# Patient Record
Sex: Female | Born: 1950 | Race: Black or African American | Hispanic: No | Marital: Single | Smoking: Former smoker
Health system: Southern US, Community
[De-identification: ages and names within clinical notes are randomized; demographics above are authoritative.]

## PROBLEM LIST (undated history)

## (undated) DIAGNOSIS — F418 Other specified anxiety disorders: Secondary | ICD-10-CM

## (undated) DIAGNOSIS — R519 Headache, unspecified: Secondary | ICD-10-CM

## (undated) DIAGNOSIS — M1990 Unspecified osteoarthritis, unspecified site: Secondary | ICD-10-CM

## (undated) DIAGNOSIS — D219 Benign neoplasm of connective and other soft tissue, unspecified: Secondary | ICD-10-CM

## (undated) DIAGNOSIS — M2550 Pain in unspecified joint: Secondary | ICD-10-CM

## (undated) DIAGNOSIS — K5792 Diverticulitis of intestine, part unspecified, without perforation or abscess without bleeding: Secondary | ICD-10-CM

## (undated) DIAGNOSIS — K219 Gastro-esophageal reflux disease without esophagitis: Secondary | ICD-10-CM

## (undated) DIAGNOSIS — E7800 Pure hypercholesterolemia, unspecified: Secondary | ICD-10-CM

## (undated) DIAGNOSIS — H04123 Dry eye syndrome of bilateral lacrimal glands: Secondary | ICD-10-CM

## (undated) DIAGNOSIS — R51 Headache: Secondary | ICD-10-CM

## (undated) DIAGNOSIS — I10 Essential (primary) hypertension: Secondary | ICD-10-CM

## (undated) HISTORY — PX: COLONOSCOPY: SHX174

## (undated) HISTORY — DX: Pain in unspecified joint: M25.50

---
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
## 2005-04-08 ENCOUNTER — Emergency Department (HOSPITAL_COMMUNITY): Admission: EM | Admit: 2005-04-08 | Discharge: 2005-04-08 | Payer: Self-pay | Admitting: Emergency Medicine

## 2005-10-29 ENCOUNTER — Encounter: Admission: RE | Admit: 2005-10-29 | Discharge: 2005-10-29 | Payer: Self-pay | Admitting: Unknown Physician Specialty

## 2007-03-05 ENCOUNTER — Encounter (INDEPENDENT_AMBULATORY_CARE_PROVIDER_SITE_OTHER): Payer: Self-pay | Admitting: Internal Medicine

## 2007-03-05 ENCOUNTER — Observation Stay (HOSPITAL_COMMUNITY): Admission: EM | Admit: 2007-03-05 | Discharge: 2007-03-07 | Payer: Self-pay | Admitting: Emergency Medicine

## 2009-02-10 ENCOUNTER — Emergency Department (HOSPITAL_COMMUNITY): Admission: EM | Admit: 2009-02-10 | Discharge: 2009-02-10 | Payer: Self-pay | Admitting: Emergency Medicine

## 2010-02-26 ENCOUNTER — Observation Stay (HOSPITAL_COMMUNITY): Admission: EM | Admit: 2010-02-26 | Discharge: 2010-02-27 | Payer: Self-pay | Admitting: Emergency Medicine

## 2010-08-12 ENCOUNTER — Ambulatory Visit (HOSPITAL_COMMUNITY): Admission: RE | Admit: 2010-08-12 | Payer: Self-pay | Source: Home / Self Care | Admitting: Obstetrics and Gynecology

## 2010-11-08 LAB — DIFFERENTIAL
Basophils Relative: 0 % (ref 0–1)
Eosinophils Absolute: 0.1 10*3/uL (ref 0.0–0.7)
Neutro Abs: 6.7 10*3/uL (ref 1.7–7.7)
Neutrophils Relative %: 73 % (ref 43–77)

## 2010-11-08 LAB — COMPREHENSIVE METABOLIC PANEL
Albumin: 3.4 g/dL — ABNORMAL LOW (ref 3.5–5.2)
BUN: 10 mg/dL (ref 6–23)
Calcium: 8.6 mg/dL (ref 8.4–10.5)
Chloride: 100 mEq/L (ref 96–112)
GFR calc Af Amer: >60 mL/min (ref >60)
Glucose, Bld: 112 mg/dL — ABNORMAL HIGH (ref 70–99)
Potassium: 3.7 mEq/L (ref 3.5–5.1)
Sodium: 136 mEq/L (ref 135–145)

## 2010-11-08 LAB — BASIC METABOLIC PANEL
BUN: 10 mg/dL (ref 6–23)
CO2: 31 mEq/L (ref 19–32)
Calcium: 8.4 mg/dL (ref 8.4–10.5)
Chloride: 107 mEq/L (ref 96–112)
Creatinine, Ser: 0.87 mg/dL (ref 0.4–1.2)
GFR calc Af Amer: >60 mL/min (ref >60)
GFR calc non Af Amer: >60 mL/min (ref >60)
Sodium: 142 mEq/L (ref 135–145)

## 2010-11-08 LAB — CBC
HCT: 37.8 % (ref 36.0–46.0)
MCH: 29.8 pg (ref 26.0–34.0)
MCHC: 33.3 g/dL (ref 30.0–36.0)
Platelets: 367 10*3/uL (ref 150–400)
WBC: 5.3 10*3/uL (ref 4.0–10.5)
WBC: 9.2 10*3/uL (ref 4.0–10.5)

## 2010-11-08 LAB — CULTURE, BLOOD (ROUTINE X 2)

## 2010-11-08 LAB — URINE MICROSCOPIC-ADD ON

## 2010-11-08 LAB — LIPASE, BLOOD: Lipase: 19 U/L (ref 11–59)

## 2010-11-08 LAB — LIPID PANEL
HDL: 33 mg/dL — ABNORMAL LOW (ref >39)
Total CHOL/HDL Ratio: 5 RATIO
VLDL: 16 mg/dL (ref 0–40)

## 2010-11-08 LAB — URINALYSIS, ROUTINE W REFLEX MICROSCOPIC
Bilirubin Urine: NEGATIVE
Glucose, UA: NEGATIVE mg/dL
Ketones, ur: NEGATIVE mg/dL
Protein, ur: NEGATIVE mg/dL
Specific Gravity, Urine: 1.016 (ref 1.005–1.030)
Urobilinogen, UA: 0.2 mg/dL (ref 0.0–1.0)
pH: 7 (ref 5.0–8.0)

## 2010-11-08 LAB — URINE CULTURE

## 2010-11-30 LAB — POCT I-STAT, CHEM 8
BUN: 14 mg/dL (ref 6–23)
Chloride: 102 mEq/L (ref 96–112)
Creatinine, Ser: 1 mg/dL (ref 0.4–1.2)
Glucose, Bld: 113 mg/dL — ABNORMAL HIGH (ref 70–99)
HCT: 39 % (ref 36.0–46.0)
Hemoglobin: 13.3 g/dL (ref 12.0–15.0)
TCO2: 29 mmol/L (ref 0–100)

## 2010-11-30 LAB — POCT CARDIAC MARKERS
Troponin i, poc: <0.05 ng/mL (ref 0.00–0.09)
Troponin i, poc: <0.05 ng/mL (ref 0.00–0.09)

## 2011-01-05 NOTE — Discharge Summary (Signed)
[name], [name]                ACCOUNT NO.:  [id number]   MEDICAL RECORD NO.:  [id number]          PATIENT TYPE:  INP   LOCATION:  5506                         FACILITY:  [facility]   PHYSICIAN:  Hillery Aldo, M.D.   DATE OF BIRTH:  09-Apr-1951   DATE OF ADMISSION:  03/05/2007  DATE OF DISCHARGE:                               DISCHARGE SUMMARY   PRIMARY CARE PHYSICIAN:  [facility]   DISCHARGE DIAGNOSES:  1. Noncardiac chest pain.  2. Uncontrolled hypertension.  3. Dyslipidemia.  4. Mild obesity.   DISCHARGE MEDICATIONS:  1. Metoprolol 12.5 mg b.i.d.  2. Hydrochlorothiazide 25 mg daily.  3. Avapro 300 mg daily.  4. Lipitor 20 mg daily.   CONSULTATIONS:  Dr. Amil Amen of cardiology.   PROCEDURES AND DIAGNOSTIC STUDIES:  1. Chest x-ray on March 05, 2007, showed low-volume lordotic film with      cardiomegaly and vascular congestion.  2. Repeat chest x-ray on March 05, 2007, showed cardiomegaly and mild      vascular congestion.  3. A 2-D echocardiogram on March 05, 2007, showed normal left      ventricular systolic function with an ejection fraction estimated      to be 70%.  There were no left ventricular regional wall motion      abnormalities.  Left ventricular wall thickness was mildly to      moderately increased.  The left atrium was mild to moderately      dilated.  4. Cardiolite on March 06, 2007, showed no definite inducible ischemia      within the limits of breast attenuation of the anterior apex.      There was normal wall motion.  Calculated ejection fraction was      65%.   DISCHARGE LABORATORY DATA:  Cardiac enzymes were negative x3 sets.   HOSPITAL COURSE BY PROBLEM:  #1 - ATYPICAL CHEST PAIN.  The patient had  fairly atypical chest pain but given her cardiac risk factors, a  cardiology consultation was requested and kindly provided by Dr.  Amil Amen.  Due to her abnormalities on chest radiography, a 2-D  echocardiogram was obtained with the findings as  noted above.  Cardiac  enzymes were cycled q.8h. x3 sets and were negative.  Twelve-lead EKG  tracings were within normal limits.  She underwent further diagnostic  testing with a Cardiolite which showed normal images and a normal  ejection fraction.  Impression was noncardiac chest pain and mild  hypertensive heart disease with the recommendation to aggressively  control blood pressure.  At this point, the patient is stable for  discharge and chest pain has completely resolved.  It was likely due to  musculoskeletal causes.   #2 - UNCONTROLLED HYPERTENSION.  The patient's blood pressure was  markedly elevated on admission.  Her admission blood pressure was  171/108.  She was put on an aggressive antihypertensive regimen and her  discharge blood pressure is 123/74.   #3 - DYSLIPIDEMIA.  The patient's fasting lipid panel was checked to  further risk stratify her for coronary disease.  Her total cholesterol  was 198, triglycerides 71, LDL 151, HDL 33.  She was started on statin  therapy and should have a comprehensive metabolic panel in six weeks'  time to ensure that her liver function studies remained stable.   #4 - MILD HYPERTENSIVE HEART DISEASE.  Again, the recommendations are  for strict blood pressure control.   #5 - MILD OBESITY.  The patient was instructed on weight loss.   DISPOSITION:  The patient is stable for discharge home.  She should  follow up with [facility] in one week's time.      Hillery Aldo, M.D.  Electronically Signed     CR/MEDQ  D:  03/07/2007  T:  03/07/2007  Job:  [id number]   cc:   [facility]

## 2011-01-05 NOTE — Consult Note (Signed)
[name], [name]                ACCOUNT NO.:  [id number]   MEDICAL RECORD NO.:  [id number]          PATIENT TYPE:  INP   LOCATION:  5506                         FACILITY:  [facility]   PHYSICIAN:  Francisca December, M.D.  DATE OF BIRTH:  [birth year]   DATE OF CONSULTATION:  03/05/2007  DATE OF DISCHARGE:                                 CONSULTATION   REQUESTING PHYSICIAN:  Dr. Trula Ore Rama.   REASON FOR CONSULTATION:  Chest pain.   HISTORY OF PRESENT ILLNESS:  [name] is a 60 year old woman without  previous cardiac history who yesterday developed spontaneous left-sided  arm, chest and left leg pain.  It was relatively sharp and lasted about  10 minutes.  She then began having anterior substernal chest heaviness  with a sensation of food caught in her esophagus.  It lasted for 40  minutes.  It was associated with some mild dyspnea but no nausea or  diaphoresis.  She came to [facility] Emergency Room  and has been admitted for rule out of myocardial infarction.  By that  time she came to the emergency room, the discomfort had resolved without  intervention.   PAST MEDICAL HISTORY:  1. Hypertension.  2. Hyperlipidemia.   CURRENT MEDICATIONS:  Benicar/hydrochlorothiazide 40/12.5.   DRUG ALLERGIES:  NONE KNOWN.   FAMILY HISTORY:  Coronary disease in her grandmother.  Not early.   SOCIAL HISTORY:  Nonsmoker.  Nondrinker.  No drug abuse.  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   REVIEW OF SYSTEMS:  Negative except as mentioned above.   PHYSICAL EXAMINATION:  VITAL SIGNS:  Blood pressure is 136/86, pulse is  71 regular, temperature 97.2, respirations 20, O2 saturation 99% on room  air.  GENERAL:  This a well-appearing, mildly obese 60 year old woman,  pleasant, conversant, no distress.  HEENT:  Unremarkable.  NECK:  Supple without thyromegaly or masses.  The carotid strokes are  normal.  There is no bruit.  There is no JVD.  CHEST:  Clear with  adequate excursion.  HEART:  Regular rhythm, normal S1-S2, no murmur, click or rub.  ABDOMEN:  Soft, nontender.  No midline pulsatile mass.  EXTERNAL GENITALIA:  Not examined.  RECTAL:  Not performed.  EXTREMITIES:  Full range of motion.  No edema.  Intact distal pulses.  NEUROLOGIC:  Cranial nerves II-XII are intact.  Motor and sensory  grossly intact.  Gait not tested.  SKIN:  Warm, dry and clear.   Electrocardiogram with normal sinus rhythm, normal EKG.   Point of care initial cardiac enzymes, troponin less than 0.05, CK-MB  1.3.  Subsequent cardiac enzymes per hospital laboratory CK-MB 2.3,  total CK 233, troponin 0.01.   Her LDL cholesterol is 151, total cholesterol 198, LDL cholesterol is  33.  Other admission laboratories are unremarkable.  Hemoglobin slightly  low at 12.   Chest x-ray does show cardiac enlargement.  This is a portable in  emergency room study.   ASSESSMENT:  1. Atypical angina.  I do not believe the left sided pain is cardiac  in nature though.  2. Risk factors for coronary disease including age, hypertension,      hyperlipidemia.  The patient had been taking a Statin previously,      discontinued on her own.  3. Cardiac enlargement on chest x-ray.  4. Hypertension, borderline control.   PLAN:  1. The patient requires further evaluation with a second set of      cardiac enzymes and repeat a ECG.  I will her repeat chest x-ray in      radiology.  The patient needs a 2-D echocardiogram to further      evaluate if cardiac enlargement is again confirmed.  2. The patient would benefit from exercise myocardial perfusion      imaging.  Probably would require a two-day study given her weight      of 212 pounds.  This could be obtained as an outpatient.  This      depends on the results of the studies above.      Francisca December, M.D.  Electronically Signed     JHE/MEDQ  D:  03/05/2007  T:  03/06/2007  Job:  [id number]

## 2011-01-05 NOTE — H&P (Signed)
[name], [name]                ACCOUNT NO.:  [id number]   MEDICAL RECORD NO.:  [id number]          PATIENT TYPE:  EMS   LOCATION:  MAJO                         FACILITY:  [facility]   PHYSICIAN:  Isidor Holts, M.D.  DATE OF BIRTH:  November 27, 1950   DATE OF ADMISSION:  03/05/2007  DATE OF DISCHARGE:                              HISTORY & PHYSICAL   PMD:  [facility].   CHIEF COMPLAINT:  Pain left shoulder, left arm, and left leg late last  night and early this a.m.   HISTORY OF PRESENT ILLNESS:  This is a 60 year old female.  For Past  Medical History, see below.  According to the patient, she was quite  well until she went to bed around 10 p.m. March 04, 2007.  Once she got  into bed she developed pain in the left shoulder shooting all the way  down her left arm; also down into her left leg and the toes of her left  foot.  According to her, this pain remains quite constant, increasing in  intensity.  Then at about 11:55 p.m. she developed chest pressure  accompanied by mild shortness of breath.  No associated diaphoresis or  nausea.  Her friend brought her to the emergency department, and at the  time of this evaluation at 3:48 a.m, she still complained of residual  discomfort.   PAST MEDICAL HISTORY:  1. Hypertension.  2. Status post open cholecystectomy in the 1970s.   MEDICATIONS:  Benicar/HCT (40/12.5) one p.o. daily.   ALLERGIES:  No known drug allergies.   REVIEW OF SYSTEMS:  Essentially as per HPI and Chief Complaint.  The  patient denies abdominal pain, vomiting or diarrhea.  Denies pyrexia.   SOCIAL HISTORY:  The patient works as a Merchandiser, retail at [employer]  This  is a sedentary job.  She is an ex-smoker who quit in 1986; prior to that  had been smoking for 10 years.  No history of alcohol use or drug abuse.  The patient has two offspring.   FAMILY HISTORY:  Mother is deceased at age 73 years.  She had CHF.  Father deceased at age 21 years following  complications after surgical  repair of an inguinal hernia.   PHYSICAL EXAMINATION:  VITALS:  Temperature 98.3, pulse 81 per minute  regular, respiratory rate 20, BP 171/108 mmHg, pulse oximeter 95% on  room air.  GENERAL:  The patient does not appear to be in obvious acute discomfort.  Alert, communicative, not short of breath at rest.  HEENT:  No clinical pallor or jaundice.  No conjunctival injection.  NECK:  Supple.  JVP not seen.  No palpable lymphadenopathy.  No palpable  goiter.  No carotid bruits.  CHEST:  Clinically clear to auscultation.  No wheezes or crackles.  HEART:  Heart sounds 1, 2 heard normal regular, no murmurs.  ABDOMEN:  Moderately obese, soft, and nontender.  No palpable  organomegaly.  No palpable masses or bowel sounds.  Old surgical scar is  noted in right upper quadrant.  EXTREMITIES:  No extremity examination, no pitting edema.  Palpable  peripheral pulses.  MUSCULOSKELETAL:  The patient has full range of motion of the neck.  However, left shoulder movement elicits pain on elevation above the  horizontal and also on internal rotation.  She also experiences some  thoracic pain on lateral rotation of the torso to the left.  Rest of the  musculoskeletal system examination is only remarkable for pain in the  left thigh and knee on straight leg raising.  She has full range of  motion in the hips and in the knees.  CENTRAL NERVOUS SYSTEM:  No focal neurologic deficit on gross  examination.   LABORATORY DATA:  CBC:  WBC 6.5, hemoglobin 12, hematocrit 35.8,  platelets 354.  Electrolytes:  Sodium 140, potassium 3.5, chloride 106,  CO2 27.1, BUN 10, creatinine 0.8, glucose 126.  Troponin I point of care  enzyme 0.05.   Chest x-ray dated March 05, 2007 shows no acute findings on IMAGECAST.  The film is a soft one, secondary to patient's body habitus.  However,  there appears to be upper lobe blood diversion bilaterally but no overt  pulmonary edema.  Formal report  is still pending.  EKG dated March 05, 2007 shows sinus rhythm regular, 84 per minute, normal axis, no acute  ischemic changes.   ASSESSMENT AND PLAN:  1. Left shoulder/left leg musculoskeletal pain, query positional.  We      shall manage this with analgesics.   1. Uncontrolled hypertension.  This will be addressed with pre-      admission antihypertensive medications, in addition to beta      blockade.   1. Chest pain.  The patient has risk factors for coronary artery      disease including age and hypertension.  We shall therefore cycle      cardiac enzymes, place the patient on low dose Aspirin.  Do lipid      profile and TSH.  However, it is likely that patient is going to      need a stress test for risk stratification.   Further management will depend on clinical course.      Isidor Holts, M.D.  Electronically Signed     CO/MEDQ  D:  03/05/2007  T:  03/06/2007  Job:  [id number]

## 2011-05-16 ENCOUNTER — Inpatient Hospital Stay (INDEPENDENT_AMBULATORY_CARE_PROVIDER_SITE_OTHER)
Admission: RE | Admit: 2011-05-16 | Discharge: 2011-05-16 | Disposition: A | Discharge status: Home / Self Care | Payer: 59 | Source: Ambulatory Visit | Attending: Emergency Medicine | Admitting: Emergency Medicine

## 2011-05-16 DIAGNOSIS — IMO0002 Reserved for concepts with insufficient information to code with codable children: Secondary | ICD-10-CM

## 2011-06-08 LAB — BASIC METABOLIC PANEL
BUN: 9
BUN: 9
CO2: 24
CO2: 27
Calcium: 8.5
Calcium: 8.6
Chloride: 107
Chloride: 107
Creatinine, Ser: 0.71
Creatinine, Ser: 0.71
GFR calc Af Amer: >60
GFR calc Af Amer: >60
GFR calc non Af Amer: >60
GFR calc non Af Amer: >60
Glucose, Bld: 119 — ABNORMAL HIGH
Glucose, Bld: 122 — ABNORMAL HIGH
Potassium: 4
Potassium: 4.1
Sodium: 137
Sodium: 139

## 2011-06-08 LAB — CK TOTAL AND CKMB (NOT AT ARMC)
CK, MB: 1.9
CK, MB: 2.2
CK, MB: 2.3
Relative Index: 1
Relative Index: 1
Total CK: 189 — ABNORMAL HIGH
Total CK: 209 — ABNORMAL HIGH
Total CK: 233 — ABNORMAL HIGH

## 2011-06-08 LAB — LIPID PANEL: Cholesterol: 198

## 2011-06-08 LAB — CBC
HCT: 35.8 — ABNORMAL LOW
Hemoglobin: 12
MCHC: 33.6
MCV: 87.9
Platelets: 354
RBC: 4.07
RDW: 14.3 — ABNORMAL HIGH
WBC: 6.5

## 2011-06-08 LAB — I-STAT 8, (EC8 V) (CONVERTED LAB)
BUN: 10
Bicarbonate: 27.1 — ABNORMAL HIGH
Chloride: 106
Glucose, Bld: 126 — ABNORMAL HIGH
Operator id: 192351
pCO2, Ven: 51.4 — ABNORMAL HIGH
pH, Ven: 7.33 — ABNORMAL HIGH

## 2011-06-08 LAB — DIFFERENTIAL
Basophils Absolute: 0
Basophils Relative: 1
Eosinophils Absolute: 0.2
Eosinophils Relative: 3
Lymphocytes Relative: 46
Lymphs Abs: 3
Monocytes Absolute: 0.7
Monocytes Relative: 11
Neutro Abs: 2.6
Neutrophils Relative %: 40 — ABNORMAL LOW

## 2011-06-08 LAB — POCT CARDIAC MARKERS
CKMB, poc: 1.3
CKMB, poc: 2.2
Myoglobin, poc: 120
Myoglobin, poc: 91.8
Operator id: 192351
Operator id: 192351
Troponin i, poc: <0.05
Troponin i, poc: <0.05

## 2011-06-08 LAB — TSH: TSH: 1.607

## 2011-06-08 LAB — TROPONIN I
Troponin I: 0.01
Troponin I: 0.01

## 2011-08-02 ENCOUNTER — Encounter: Payer: Self-pay | Admitting: *Deleted

## 2011-08-02 ENCOUNTER — Emergency Department (HOSPITAL_COMMUNITY)
Admission: EM | Admit: 2011-08-02 | Discharge: 2011-08-03 | Disposition: A | Discharge status: Home / Self Care | Payer: 59 | Attending: Emergency Medicine | Admitting: Emergency Medicine

## 2011-08-02 ENCOUNTER — Other Ambulatory Visit: Payer: Self-pay

## 2011-08-02 DIAGNOSIS — R0602 Shortness of breath: Secondary | ICD-10-CM | POA: Insufficient documentation

## 2011-08-02 DIAGNOSIS — R112 Nausea with vomiting, unspecified: Secondary | ICD-10-CM | POA: Insufficient documentation

## 2011-08-02 DIAGNOSIS — R059 Cough, unspecified: Secondary | ICD-10-CM | POA: Insufficient documentation

## 2011-08-02 DIAGNOSIS — R42 Dizziness and giddiness: Secondary | ICD-10-CM | POA: Insufficient documentation

## 2011-08-02 DIAGNOSIS — R209 Unspecified disturbances of skin sensation: Secondary | ICD-10-CM | POA: Insufficient documentation

## 2011-08-02 DIAGNOSIS — J069 Acute upper respiratory infection, unspecified: Secondary | ICD-10-CM | POA: Insufficient documentation

## 2011-08-02 DIAGNOSIS — R202 Paresthesia of skin: Secondary | ICD-10-CM

## 2011-08-02 DIAGNOSIS — R05 Cough: Secondary | ICD-10-CM | POA: Insufficient documentation

## 2011-08-02 DIAGNOSIS — R079 Chest pain, unspecified: Secondary | ICD-10-CM | POA: Insufficient documentation

## 2011-08-02 DIAGNOSIS — R5383 Other fatigue: Secondary | ICD-10-CM | POA: Insufficient documentation

## 2011-08-02 DIAGNOSIS — H9319 Tinnitus, unspecified ear: Secondary | ICD-10-CM | POA: Insufficient documentation

## 2011-08-02 DIAGNOSIS — I10 Essential (primary) hypertension: Secondary | ICD-10-CM | POA: Insufficient documentation

## 2011-08-02 DIAGNOSIS — R630 Anorexia: Secondary | ICD-10-CM | POA: Insufficient documentation

## 2011-08-02 DIAGNOSIS — R5381 Other malaise: Secondary | ICD-10-CM | POA: Insufficient documentation

## 2011-08-02 DIAGNOSIS — R509 Fever, unspecified: Secondary | ICD-10-CM | POA: Insufficient documentation

## 2011-08-02 DIAGNOSIS — M25519 Pain in unspecified shoulder: Secondary | ICD-10-CM | POA: Insufficient documentation

## 2011-08-02 DIAGNOSIS — Z79899 Other long term (current) drug therapy: Secondary | ICD-10-CM | POA: Insufficient documentation

## 2011-08-02 HISTORY — DX: Essential (primary) hypertension: I10

## 2011-08-02 LAB — DIFFERENTIAL
Basophils Absolute: 0 10*3/uL (ref 0.0–0.1)
Basophils Relative: 1 % (ref 0–1)
Eosinophils Absolute: 0.3 10*3/uL (ref 0.0–0.7)
Eosinophils Relative: 7 % — ABNORMAL HIGH (ref 0–5)
Lymphocytes Relative: 35 % (ref 12–46)
Monocytes Absolute: 0.3 10*3/uL (ref 0.1–1.0)

## 2011-08-02 LAB — POCT I-STAT, CHEM 8
Calcium, Ion: 1.11 mmol/L — ABNORMAL LOW (ref 1.12–1.32)
Chloride: 98 mEq/L (ref 96–112)
HCT: 40 % (ref 36.0–46.0)
Sodium: 140 mEq/L (ref 135–145)
TCO2: 29 mmol/L (ref 0–100)

## 2011-08-02 LAB — COMPREHENSIVE METABOLIC PANEL
AST: 33 U/L (ref 0–37)
CO2: 30 mEq/L (ref 19–32)
Calcium: 9.1 mg/dL (ref 8.4–10.5)
Creatinine, Ser: 0.72 mg/dL (ref 0.50–1.10)
GFR calc non Af Amer: >90 mL/min (ref >90)
Total Protein: 7.3 g/dL (ref 6.0–8.3)

## 2011-08-02 LAB — POCT I-STAT TROPONIN I: Troponin i, poc: 0.02 ng/mL (ref 0.00–0.08)

## 2011-08-02 LAB — CBC
HCT: 38.5 % (ref 36.0–46.0)
MCH: 28.9 pg (ref 26.0–34.0)
MCHC: 33.8 g/dL (ref 30.0–36.0)
MCV: 85.6 fL (ref 78.0–100.0)
Platelets: 295 10*3/uL (ref 150–400)
RDW: 14.2 % (ref 11.5–15.5)

## 2011-08-02 LAB — CK TOTAL AND CKMB (NOT AT ARMC)
CK, MB: 5 ng/mL — ABNORMAL HIGH (ref 0.3–4.0)
Relative Index: 0.7 (ref 0.0–2.5)

## 2011-08-02 MED ORDER — HYDROCOD POLST-CHLORPHEN POLST 10-8 MG/5ML PO LQCR
5.0000 mL | Freq: Once | ORAL | Status: AC
  Administered 2011-08-02: 5 mL via ORAL
  Filled 2011-08-02: qty 5

## 2011-08-02 NOTE — ED Provider Notes (Signed)
History     CSN: [id number] Arrival date & time: 08/02/2011  9:27 PM   First MD Initiated Contact with Patient 08/02/11 2333      Chief Complaint  Patient presents with  . Numbness    (Consider location/radiation/quality/duration/timing/severity/associated sxs/prior treatment) HPI 60 year old female presents to emergency room complaining of fingers of her right hand tingling. Patient reports onset of the tingling started this morning around 7 AM. The tingling has been intermittent, a pins and needle sensation in fingers only, not including the hand. Symptoms will last for about 30 minutes and then resolve. Around 4 PM it had resolved completely. Around 6 PM she noticed a fullness and ringing in her right ear and tingling in her right shoulder along the rotator cuff distribution. Along with this she had return of tingling in the fingers. Patient denies any tingling along the arm itself. Patient reports she's been ill over the weekend with fevers to 103 on Saturday with problems with her balance at times. She has been coughing so much her to her chest hurts. Patient's been taking Mucinex with minimal improvement. No fevers since Saturday. Patient has felt nauseated and had one episode of vomiting. She denies any headache. Patient has short period of dizziness upon arrival to the ER but has since felt fine. She is eating and drinking well. Patient works in Engineering geologist. She denies prior history of rotator cuff or carpal tunnel syndrome. Patient reports history of degenerative disc disease Past Medical History  Diagnosis Date  . Hypertension     History reviewed. No pertinent past surgical history.  History reviewed. No pertinent family history.  History  Substance Use Topics  . Smoking status: Never Smoker   . Smokeless tobacco: Not on file  . Alcohol Use: No    OB History    Grav Para Term Preterm Abortions TAB SAB Ect Mult Living                  Review of Systems  Constitutional:  Positive for fever, chills, appetite change and fatigue.  HENT: Positive for tinnitus. Negative for nosebleeds, congestion, sore throat, rhinorrhea, sneezing, trouble swallowing, neck pain, neck stiffness, voice change and postnasal drip.   Eyes: Negative.   Respiratory: Positive for cough, chest tightness and shortness of breath.   Cardiovascular: Positive for chest pain.  Gastrointestinal: Positive for nausea and vomiting.  Genitourinary: Negative.   Musculoskeletal: Negative.   Skin: Negative.   Neurological: Positive for dizziness, light-headedness and numbness. Negative for syncope, speech difficulty, weakness and headaches.  All other systems reviewed and are negative.    Allergies  Review of patient's allergies indicates no known allergies.  Home Medications   Current Outpatient Rx  Name Route Sig Dispense Refill  . ATORVASTATIN CALCIUM 20 MG PO TABS Oral Take 20 mg by mouth at bedtime.      Marland Kitchen NAPROXEN SODIUM 220 MG PO TABS Oral Take 220 mg by mouth daily as needed. For pain     . DARVON PO Oral Take 1 tablet by mouth daily. 80/12.5 mg       BP 107/68  Pulse 94  Temp(Src) 98 F (36.7 C) (Oral)  Resp 28  SpO2 99%  Physical Exam  Nursing note and vitals reviewed. Constitutional: She is oriented to person, place, and time. She appears well-developed and well-nourished.  HENT:  Head: Normocephalic and atraumatic.  Nose: Nose normal.  Mouth/Throat: Oropharynx is clear and moist.       Serous fluid noted behind both  TMs without erythema bulging or signs of acute infection  Eyes: Conjunctivae and EOM are normal. Pupils are equal, round, and reactive to light.  Neck: Normal range of motion. Neck supple. No JVD present. No tracheal deviation present. No thyromegaly present.  Cardiovascular: Normal rate, regular rhythm, normal heart sounds and intact distal pulses.  Exam reveals no gallop and no friction rub.   No murmur heard. Pulmonary/Chest: Effort normal and breath  sounds normal. No stridor. No respiratory distress. She has no wheezes. She has no rales. She exhibits no tenderness.  Abdominal: Soft. Bowel sounds are normal. She exhibits no distension and no mass. There is no tenderness. There is no rebound and no guarding.  Musculoskeletal: Normal range of motion. She exhibits tenderness (tenderness with palpation over right rotator cuff and trapezius muscles). She exhibits no edema.  Lymphadenopathy:    She has no cervical adenopathy.  Neurological: She is alert and oriented to person, place, and time. She has normal reflexes. No cranial nerve deficit. She exhibits normal muscle tone. Coordination normal.  Skin: Skin is dry. No rash noted. No erythema. No pallor.  Psychiatric: She has a normal mood and affect. Her behavior is normal. Judgment and thought content normal.    ED Course  Procedures (including critical care time)  Labs Reviewed  CBC - Abnormal; Notable for the following:    WBC 3.6 (*)    All other components within normal limits  DIFFERENTIAL - Abnormal; Notable for the following:    Eosinophils Relative 7 (*)    All other components within normal limits  COMPREHENSIVE METABOLIC PANEL - Abnormal; Notable for the following:    Potassium 3.3 (*)    Glucose, Bld 113 (*)    All other components within normal limits  CK TOTAL AND CKMB - Abnormal; Notable for the following:    Total CK 744 (*)    CK, MB 5.0 (*)    All other components within normal limits  POCT I-STAT, CHEM 8 - Abnormal; Notable for the following:    Potassium 3.3 (*)    Glucose, Bld 112 (*)    Calcium, Ion 1.11 (*)    All other components within normal limits  POCT I-STAT TROPONIN I  I-STAT TROPONIN I   Ct Head Wo Contrast  08/03/2011  *RADIOLOGY REPORT*  Clinical Data: Dizziness.  Right hand numbness.  CT HEAD WITHOUT CONTRAST  Technique:  Contiguous axial images were obtained from the base of the skull through the vertex without contrast.  Comparison: None.   Findings: There is no evidence for acute hemorrhage, hydrocephalus, mass lesion, or abnormal extra-axial fluid collection.  No definite CT evidence for acute infarction.  Opacification of scattered ethmoid air cells noted.  Remaining visualized paranasal sinuses and mastoid air cells are clear.  IMPRESSION: No acute intracranial abnormality.  Original Report Authenticated By: ERIC A. MANSELL, M.D.    Date: 08/03/2011  Rate: 75  Rhythm: normal sinus rhythm  QRS Axis: normal  Intervals: normal  ST/T Wave abnormalities: normal  Conduction Disutrbances:none  Narrative Interpretation:   Old EKG Reviewed: none available    1. Paresthesias in right hand   2. Upper respiratory infection       MDM  60 year old female with paresthesias isolated to right fingers and right shoulder. These paresthesias did not follow a dermatome. Symptoms have been intermittent for 12 hours. Workup otherwise unremarkable. Patient feeling better after Tussionex for her cough and cold. Patient updated on findings and plan and is seeing her primary  care Dr. for recheck in 2-3 days. Do not feel symptoms are secondary to disc herniation, CVA, or other serious medical condition        Olivia Mackie, MD 08/03/11 [phone]

## 2011-08-02 NOTE — ED Notes (Signed)
MD at bedside. 

## 2011-08-02 NOTE — ED Notes (Signed)
The pt was ill since Friday with a high temp nausea and vomiting.  She was working today and since this am she has had a numbness in her rt hand that moved up her arm into her rt neck.  At present she has a STRANGE SENSATION in her head a type of buzzing

## 2011-08-02 NOTE — ED Notes (Signed)
Pt placed in gown and on cardiac monitor, bp cuff, and pulse ox.

## 2011-08-03 ENCOUNTER — Emergency Department (HOSPITAL_COMMUNITY): Payer: 59

## 2011-08-03 ENCOUNTER — Encounter (HOSPITAL_COMMUNITY): Payer: Self-pay | Admitting: Radiology

## 2011-08-03 MED ORDER — HYDROCOD POLST-CHLORPHEN POLST 10-8 MG/5ML PO LQCR: 5.0000 mL | Freq: Every evening | ORAL | Status: DC | PRN

## 2011-08-03 MED ORDER — NAPROXEN SODIUM 220 MG PO TABS: 220.0000 mg | ORAL_TABLET | Freq: Two times a day (BID) | ORAL | Status: DC

## 2011-08-03 MED ORDER — OXYMETAZOLINE HCL 0.05 % NA SOLN: 2.0000 | Freq: Two times a day (BID) | NASAL | Status: AC

## 2011-08-03 NOTE — ED Notes (Signed)
Patient back from CT.

## 2012-01-10 ENCOUNTER — Other Ambulatory Visit: Payer: Self-pay | Admitting: *Deleted

## 2012-01-14 ENCOUNTER — Ambulatory Visit
Admission: RE | Admit: 2012-01-14 | Discharge: 2012-01-14 | Disposition: A | Discharge status: Home / Self Care | Payer: 59 | Source: Ambulatory Visit | Attending: *Deleted | Admitting: *Deleted

## 2012-01-14 MED ORDER — IOHEXOL 300 MG/ML  SOLN
125.0000 mL | Freq: Once | INTRAMUSCULAR | Status: AC | PRN
  Administered 2012-01-14: 125 mL via INTRAVENOUS

## 2012-08-23 DIAGNOSIS — F418 Other specified anxiety disorders: Secondary | ICD-10-CM

## 2012-08-23 HISTORY — DX: Other specified anxiety disorders: F41.8

## 2013-09-30 ENCOUNTER — Encounter (HOSPITAL_COMMUNITY): Payer: Self-pay | Admitting: Emergency Medicine

## 2013-09-30 ENCOUNTER — Emergency Department (HOSPITAL_COMMUNITY)
Admission: EM | Admit: 2013-09-30 | Discharge: 2013-09-30 | Disposition: A | Discharge status: Home / Self Care | Payer: BC Managed Care – PPO | Attending: Emergency Medicine | Admitting: Emergency Medicine

## 2013-09-30 DIAGNOSIS — R51 Headache: Secondary | ICD-10-CM | POA: Insufficient documentation

## 2013-09-30 DIAGNOSIS — F419 Anxiety disorder, unspecified: Secondary | ICD-10-CM

## 2013-09-30 DIAGNOSIS — F411 Generalized anxiety disorder: Secondary | ICD-10-CM | POA: Insufficient documentation

## 2013-09-30 DIAGNOSIS — Z79899 Other long term (current) drug therapy: Secondary | ICD-10-CM | POA: Insufficient documentation

## 2013-09-30 DIAGNOSIS — E7800 Pure hypercholesterolemia, unspecified: Secondary | ICD-10-CM | POA: Insufficient documentation

## 2013-09-30 DIAGNOSIS — R42 Dizziness and giddiness: Secondary | ICD-10-CM | POA: Insufficient documentation

## 2013-09-30 DIAGNOSIS — G479 Sleep disorder, unspecified: Secondary | ICD-10-CM | POA: Insufficient documentation

## 2013-09-30 DIAGNOSIS — R29810 Facial weakness: Secondary | ICD-10-CM | POA: Insufficient documentation

## 2013-09-30 DIAGNOSIS — Z791 Long term (current) use of non-steroidal anti-inflammatories (NSAID): Secondary | ICD-10-CM | POA: Insufficient documentation

## 2013-09-30 DIAGNOSIS — I10 Essential (primary) hypertension: Secondary | ICD-10-CM | POA: Insufficient documentation

## 2013-09-30 HISTORY — DX: Pure hypercholesterolemia, unspecified: E78.00

## 2013-09-30 LAB — POCT I-STAT, CHEM 8
BUN: 11 mg/dL (ref 6–23)
CALCIUM ION: 1.2 mmol/L (ref 1.13–1.30)
CREATININE: 0.8 mg/dL (ref 0.50–1.10)
Chloride: 100 mEq/L (ref 96–112)
GLUCOSE: 142 mg/dL — AB (ref 70–99)
HCT: 42 % (ref 36.0–46.0)
Hemoglobin: 14.3 g/dL (ref 12.0–15.0)
Potassium: 3.8 mEq/L (ref 3.7–5.3)
Sodium: 142 mEq/L (ref 137–147)
TCO2: 28 mmol/L (ref 0–100)

## 2013-09-30 MED ORDER — LORAZEPAM 2 MG/ML IJ SOLN
1.0000 mg | Freq: Once | INTRAMUSCULAR | Status: AC
  Administered 2013-09-30: 1 mg via INTRAVENOUS
  Filled 2013-09-30: qty 1

## 2013-09-30 MED ORDER — ALPRAZOLAM 0.5 MG PO TABS: 0.5000 mg | ORAL_TABLET | Freq: Every evening | ORAL | Status: DC | PRN

## 2013-09-30 MED ORDER — KETOROLAC TROMETHAMINE 30 MG/ML IJ SOLN
30.0000 mg | Freq: Once | INTRAMUSCULAR | Status: AC
  Administered 2013-09-30: 30 mg via INTRAVENOUS
  Filled 2013-09-30: qty 1

## 2013-09-30 NOTE — Discharge Instructions (Signed)
°Emergency Department Resource Guide °1) Find a Doctor and Pay Out of Pocket °Although you won't have to find out who is covered by your insurance plan, it is a good idea to ask around and get recommendations. You will then need to call the office and see if the doctor you have chosen will accept you as a new patient and what types of options they offer for patients who are self-pay. Some doctors offer discounts or will set up payment plans for their patients who do not have insurance, but you will need to ask so you aren't surprised when you get to your appointment. ° °2) Contact Your Local Health Department °Not all health departments have doctors that can see patients for sick visits, but many do, so it is worth a call to see if yours does. If you don't know where your local health department is, you can check in your phone book. The CDC also has a tool to help you locate your state's health department, and many state websites also have listings of all of their local health departments. ° °3) Find a Walk-in Clinic °If your illness is not likely to be very severe or complicated, you may want to try a walk in clinic. These are popping up all over the country in pharmacies, drugstores, and shopping centers. They're usually staffed by nurse practitioners or physician assistants that have been trained to treat common illnesses and complaints. They're usually fairly quick and inexpensive. However, if you have serious medical issues or chronic medical problems, these are probably not your best option. ° °No Primary Care Doctor: °- Call [facility] at  832-8000 - they can help you locate a primary care doctor that  accepts your insurance, provides certain services, etc. °- Physician Referral Service- 1-800-533-3463 ° °Chronic Pain Problems: °Organization         Address  Phone   Notes  °[facility]  (336) 297-2271 Patients need to be referred by their primary care doctor.  ° °Medication  Assistance: °Organization         Address  Phone   Notes  °[place] [facility] [address] °[place], [place] [address] (336) 641-8030 --Must be a resident of [place] °-- Must have NO insurance coverage whatsoever (no Medicaid/ Medicare, etc.) °-- The pt. MUST have a primary care doctor that directs their care regularly and follows them in the community °  °[facility]  (866) 331-1348   °[employer]  (888) 892-1162   ° °Agencies that provide inexpensive medical care: °Organization         Address  Phone   Notes  °[facility]  (336) 832-8035   °[facility]    (336) 832-7272   °[facility] [address] °[place], [place] [address] (336) 832-4777   °[facility] [address], °[place] (336) 271-4999   °[facility]    (336) 373-0678   °[facility]    (336) 272-1050   °[facility] ° [address], [place] Phone:  (336) 832-4444, Fax:  (336) 832-4440 Hours of Operation:  9 am - 6 pm, M-F.  Also accepts Medicaid/Medicare and self-pay.  °[facility] ° [address], [place] Phone: (336) 832-3150, Fax: (336) 832-3151. Hours of Operation:  8:30 am - 5:30 pm, M-F.  Also accepts Medicaid and self-pay.  °[facility] [place] [address], [place] Phone: (336) 878-6027   °[facility] [address], [place], [place] (336)723-1848, Ext. 123 Mondays & Thursdays: 7-9 AM.  First 15 patients are seen on a first come, first serve basis. °  ° °Medicaid-accepting [place] Providers: ° °Organization         Address  Phone   Notes  °[facility] [address], [place] (336) 641-2100 Also accepts self-pay patients.  °[facility] [address], [place] ° (336) 856-9996   °[facility] [address], [place]  (336) 288-8857   °[facility] [address] [place] [address], [place] (336) 299-7000   °Veita Bland [address], [place]  ° (336) 373-1557 Only accepts [place] Access Medicaid patients after they have their name applied to their card.  ° °Self-Pay (no insurance) in [place]: ° °Organization         Address  Phone   Notes  °Sickle Cell Patients, [facility] [address], [place] (336) 832-1970   °[facility] [address], [place] (336) 832-4400   °[facility] ° [address], [place] (336) 992-4800   °[facility]/Dr. Osei-Bonsu ° [address] [place] [address], [place] or [address], [place] (336) 841-8500 Phone number for both [place] and [place] locations is the same.  °[facility] [address], [place] (336) 299-0000   °[facility] [address] [place] [address], [place] or [address] (336) 852-7530 °(336) 878-2260   °[facility] [address], [place] (336) 350-1642, phone; (336) 294-5005, fax Sees patients 1st and 3rd Saturday of every month.  Must not qualify for public or private insurance (i.e. Medicaid, Medicare, [place] Health Choice, Veterans' Benefits) • Household income should be no more than 200% of the poverty level •The clinic cannot treat you if you are pregnant or think you are pregnant • Sexually transmitted diseases are not treated at the clinic.  ° ° °Dental Care: °Organization         Address  Phone  Notes  °[place] [facility] [address], [place] (336) 641-6152 Accepts children up to age 21 who are enrolled in Medicaid or [place] Health Choice; pregnant women with a Medicaid card; and children who have applied for Medicaid or [place] Health Choice, but were declined, whose parents can pay a reduced fee at time of service.  °[place]  [facility] [place]  [address], [place] (336) 641-7733 Accepts children up to age 21 who are enrolled in Medicaid or [place] Health Choice; pregnant women with a Medicaid card; and children who have applied for Medicaid or [place] Health Choice, but were declined, whose parents can pay a reduced fee at time of service.  °[facility] ° [address], [place] (336) 641-4533 Patients are seen by appointment only. Walk-ins are not accepted. [facility] will see patients 18 years of age and older. °Monday - Tuesday (8am-5pm) °Most Wednesdays (8:30-5pm) °$30 per visit, cash only  °[facility] ° [address], [place] (336) 641-4533 Patients are seen by appointment only. Walk-ins are not accepted. [facility] will see patients 18 years of age and older. °One   Wednesday Evening (Monthly: Volunteer Based).  $30 per visit, cash only  °[facility]  (919) 537-3737 for adults; Children under age 4, call Graduate Pediatric Dentistry at (919) [phone]. Children aged 4-14, please call (919) [phone] to request a pediatric application. ° Dental services are provided in all areas of dental care including fillings, crowns and bridges, complete and partial dentures, implants, gum treatment, root canals, and extractions. Preventive care is also provided. Treatment is provided to both adults and children. °Patients are selected via a lottery and there is often a waiting list. °  °[facility] [address], °[place] ° (336) 763-8833 [email or web address] °  °[facility] [address], [place], [place] (336)723-1848, Ext. 123 Second and Fourth Thursday of each month, opens at 6:30 AM; Clinic ends at 9 AM.  Patients are seen on a first-come first-served basis, and a limited number are seen during each clinic.  ° °[facility] ° [address], [place], [place] (336) 723-7904    Eligibility Requirements °You must have lived in [place], [place], or [place] counties for at least the last three months. °  You cannot be eligible for state or federal sponsored healthcare insurance, including Veterans Administration, Medicaid, or Medicare. °  You generally cannot be eligible for healthcare insurance through your employer.  °  How to apply: °Eligibility screenings are held every Tuesday and Wednesday afternoon from 1:00 pm until 4:00 pm. You do not need an appointment for the interview!  °[facility] [address], [place], [place] 336-631-2330   °[facility]  336-342-8273   °[facility]  336-703-3100   °[facility]  336-570-6415   ° °Behavioral Health Resources in the Community: °Intensive Outpatient Programs °Organization         Address  Phone  Notes  °[facility] [address], [place], [place] 336-878-6098   °[facility] [address], [place], [place] 336-832-9800   °[facility] [address], [place], [place] ° 336-882-2125   °[facility] [address],  °[place], [place] 1-800-853-5163 or 336-641-4981   °Substance Abuse Resources °Organization         Address  Phone  Notes  °[facility]  336-882-2125   °[facility]  336-784-9470   °[facility]  336-285-9073   °[facility]  336-845-3988   °Residential & Outpatient Substance Abuse Program  1-800-659-3381   °Psychological Services °Organization         Address  Phone  Notes  °[facility]  336- 832-9600   °[facility]  336- 378-7881   °[facility] [address], [place] 1-800-853-5163 or 336-641-4981   ° °Mobile Crisis Teams °Organization         Address  Phone  Notes  °[facility], Mobile Crisis Care Unit  1-877-626-1772   °Assertive °[facility] ° [address]  [place], [place] 336-834-9664   °Sharon DeEsch [address] °[place] 336-554-5454   ° °Self-Help/Support Groups °Organization         Address  Phone             Notes  °[facility] - variety of support groups  336- 373-1402 Call for more information  °Narcotics Anonymous (NA), [facility] [address], °[place]  2 meetings at this location  ° °  Residential Treatment Programs °Organization         Address  Phone  Notes  °[facility] [address],    ° [place]  1-866-801-8205   °[facility] ° [address], [place], [place] 704-293-8524   °[facility] [address], [place] 336-845-3988 Admissions: 8am-3pm M-F  °[facility] [address]    °[place], [place] 336-841-1104   °[facility] [address], [place], [place] 336-379-7146   °[facility] [address]  °[place], [place] 336-285-9073   °[facility] Intensive Outpatient [address], [place], [place] 336-852-3033   °[facility] [address]  °[place], [place] 1-877-615-2722 or 336-784-9470   °[facility] [address] [place], [place] 336-227-7417 Accepts Medicaid  °[facility] [address]  ° [place] 1-800-659-3381 Substance Abuse/Addiction Treatment  ° °[place] Behavioral Health Resources °Organization         Address  Phone  Notes  °[facility]  (888) 581-9988   °Julie Brannon, PhD [address] [place], [place]   (336) 349-5553 or (336) 951-0000   °[facility]   [address] °[place], [place] (336) 349-4454   °[facility] [address], [place], [place] (336) 342-8316 Insurance/Medicaid/sponsorship through [facility]  °[facility] [address]                                    [place], [place] (336) 342-8316 Therapy/tele-psych/case    °[facility] [address]  ° [place], [place] (336) 349-2233    °Dr. Arfeen  (336) 349-4544   °[facility] [place]  [employer] [place] [facility] 1) [address], [place] °2) [address], [place] °3)  [address], [place] (336) 349-3220 °(336) 342-7768 ° °(336) 342-8140   °[place] [facility] (336) 342-1394 or (336) 342-3537 (After Hours)    ° ° °

## 2013-09-30 NOTE — ED Notes (Signed)
Given warm blanket 

## 2013-09-30 NOTE — ED Notes (Signed)
md at bedside

## 2013-09-30 NOTE — ED Notes (Signed)
Pt reports htn pta, recently having change in bp meds. Having sharp pains to left side of head and left ear and feels unsteady on her feet. Also reports feeling dehydrated and excessive thirst.

## 2013-09-30 NOTE — ED Provider Notes (Signed)
CSN: [id number]     Arrival date & time 09/30/13  0827 History   First MD Initiated Contact with Patient 09/30/13 [phone]     Chief Complaint  Patient presents with  . Hypertension   (Consider location/radiation/quality/duration/timing/severity/associated sxs/prior Treatment) HPI Comments: Patient presents with a headache and anxiety. She has a history of hypertension and has had trouble controlling her blood pressure for the last several years. She was doing okay on Diovan however was switched to other medications which she states is not controlling her blood pressure as well as the Diovan. She was recently started back on the Diovan 3 weeks ago. She states her blood pressures been high over the last few weeks. She also states she's been under a lot of stress recently. Her son died in 05/25/23 and she's had a hard time handling that. She denies any suicidal or homicidal ideations. She does complain of some occasional panic attacks. She states that she hasn't been sleeping very well lately. She feels like the stress is causing her to be anxious. She also complains of a left-sided headache. She's had these similar type headaches in the past but it's more intense today. She feels a little lightheaded at times. She denies any numbness or weakness in extremities. She denies any speech deficits or vision changes. She denies any chest pain or shortness of breath. She attributes this to her problems today to the stress that she's been under.  Patient is a 63 y.o. female presenting with hypertension.  Hypertension Associated symptoms include headaches. Pertinent negatives include no chest pain, no abdominal pain and no shortness of breath.    Past Medical History  Diagnosis Date  . Hypertension   . High cholesterol    History reviewed. No pertinent past surgical history. History reviewed. No pertinent family history. History  Substance Use Topics  . Smoking status: Never Smoker   . Smokeless tobacco:  Not on file  . Alcohol Use: No   OB History   Grav Para Term Preterm Abortions TAB SAB Ect Mult Living                 Review of Systems  Constitutional: Negative for fever, chills, diaphoresis and fatigue.  HENT: Negative for congestion, rhinorrhea and sneezing.   Eyes: Negative.   Respiratory: Negative for cough, chest tightness and shortness of breath.   Cardiovascular: Negative for chest pain and leg swelling.  Gastrointestinal: Negative for nausea, vomiting, abdominal pain, diarrhea and blood in stool.  Genitourinary: Negative for frequency, hematuria, flank pain and difficulty urinating.  Musculoskeletal: Negative for arthralgias and back pain.  Skin: Negative for rash.  Neurological: Positive for light-headedness and headaches. Negative for dizziness, speech difficulty, weakness and numbness.  Psychiatric/Behavioral: Positive for sleep disturbance and decreased concentration. Negative for suicidal ideas. The patient is nervous/anxious.     Allergies  Review of patient's allergies indicates no known allergies.  Home Medications   Current Outpatient Rx  Name  Route  Sig  Dispense  Refill  . ALPRAZolam (XANAX) 0.5 MG tablet   Oral   Take 1 tablet (0.5 mg total) by mouth at bedtime as needed for anxiety.   6 tablet   0   . atorvastatin (LIPITOR) 20 MG tablet   Oral   Take 20 mg by mouth at bedtime.           . chlorpheniramine-HYDROcodone (TUSSIONEX) 10-8 MG/5ML LQCR   Oral   Take 5 mLs by mouth at bedtime as needed (cough).  140 mL   0   . naproxen sodium (ANAPROX) 220 MG tablet   Oral   Take 1 tablet (220 mg total) by mouth 2 (two) times daily with a meal. For pain   60 tablet   0   . Propoxyphene HCl (DARVON PO)   Oral   Take 1 tablet by mouth daily. 80/12.5 mg          . valsartan (DIOVAN) 160 MG tablet   Oral   Take 160 mg by mouth daily.            BP 123/65  Pulse 76  Temp(Src) 98.1 F (36.7 C) (Oral)  Resp 17  Ht 5\' 5"  (1.651 m)  Wt  218 lb (98.884 kg)  BMI 36.28 kg/m2  SpO2 97% Physical Exam  Constitutional: She is oriented to person, place, and time. She appears well-developed and well-nourished.  HENT:  Head: Normocephalic and atraumatic.  Eyes: Pupils are equal, round, and reactive to light.  Neck: Normal range of motion. Neck supple.  Cardiovascular: Normal rate, regular rhythm and normal heart sounds.   Pulmonary/Chest: Effort normal and breath sounds normal. No respiratory distress. She has no wheezes. She has no rales. She exhibits no tenderness.  Abdominal: Soft. Bowel sounds are normal. There is no tenderness. There is no rebound and no guarding.  Musculoskeletal: Normal range of motion. She exhibits no edema.  Lymphadenopathy:    She has no cervical adenopathy.  Neurological: She is alert and oriented to person, place, and time.  Motor 5 out of 5 all extremities. Sensation grossly intact to light touch all extremities.  No pronator drift. Finger to nose intact. Sensation is grossly intact to light touch in the face. There is new facial drooping. Extraocular eye movements are intact. She has normal tongue movements. She has normal shoulder shrug.  Skin: Skin is warm and dry. No rash noted.  Psychiatric: She has a normal mood and affect.    ED Course  Procedures (including critical care time) Labs Review Results for orders placed during the hospital encounter of 09/30/13  POCT I-STAT, CHEM 8      Result Value Range   Sodium 142  137 - 147 mEq/L   Potassium 3.8  3.7 - 5.3 mEq/L   Chloride 100  96 - 112 mEq/L   BUN 11  6 - 23 mg/dL   Creatinine, Ser 0.80  0.50 - 1.10 mg/dL   Glucose, Bld 142 (*) 70 - 99 mg/dL   Calcium, Ion 1.20  1.13 - 1.30 mmol/L   TCO2 28  0 - 100 mmol/L   Hemoglobin 14.3  12.0 - 15.0 g/dL   HCT 42.0  36.0 - 46.0 %   No results found.   Imaging Review No results found.  EKG Interpretation    Date/Time:  Sunday September 30 2013 08:46:07 EST Ventricular Rate:  91 PR  Interval:  131 QRS Duration: 82 QT Interval:  368 QTC Calculation: 453 R Axis:   33 Text Interpretation:  Sinus rhythm Minimal ST depression, inferior leads similar to prior EKGs of 03/05/07 and 08/02/11 Confirmed by Twinkle Sockwell  MD, Tonnette Zwiebel ([id number]) on 09/30/2013 9:51:14 AM            MDM   1. Hypertension   2. Anxiety    Patient's blood pressures improved. She is given one dose of Ativan as well as Toradol and 80. She is feeling much better. I talked to her at being about outpatient followup. I also talked to  her about possible resources for followup regarding the stress that she's been under and the difficulty she had dealing the death of her son. She at this point and does not exhibit any suicidal ideations.     Malvin Johns, MD 09/30/13 1024

## 2014-02-11 ENCOUNTER — Emergency Department (HOSPITAL_COMMUNITY): Payer: BC Managed Care – PPO

## 2014-02-11 ENCOUNTER — Encounter (HOSPITAL_COMMUNITY): Payer: Self-pay | Admitting: Emergency Medicine

## 2014-02-11 DIAGNOSIS — R5381 Other malaise: Secondary | ICD-10-CM | POA: Insufficient documentation

## 2014-02-11 DIAGNOSIS — Z8659 Personal history of other mental and behavioral disorders: Secondary | ICD-10-CM | POA: Insufficient documentation

## 2014-02-11 DIAGNOSIS — R079 Chest pain, unspecified: Secondary | ICD-10-CM | POA: Insufficient documentation

## 2014-02-11 DIAGNOSIS — I10 Essential (primary) hypertension: Secondary | ICD-10-CM | POA: Insufficient documentation

## 2014-02-11 DIAGNOSIS — Z79899 Other long term (current) drug therapy: Secondary | ICD-10-CM | POA: Insufficient documentation

## 2014-02-11 DIAGNOSIS — R0602 Shortness of breath: Secondary | ICD-10-CM | POA: Insufficient documentation

## 2014-02-11 DIAGNOSIS — R5383 Other fatigue: Secondary | ICD-10-CM

## 2014-02-11 DIAGNOSIS — E041 Nontoxic single thyroid nodule: Secondary | ICD-10-CM | POA: Insufficient documentation

## 2014-02-11 DIAGNOSIS — Z791 Long term (current) use of non-steroidal anti-inflammatories (NSAID): Secondary | ICD-10-CM | POA: Insufficient documentation

## 2014-02-11 DIAGNOSIS — R509 Fever, unspecified: Secondary | ICD-10-CM | POA: Insufficient documentation

## 2014-02-11 LAB — URINE MICROSCOPIC-ADD ON

## 2014-02-11 LAB — BASIC METABOLIC PANEL
BUN: 12 mg/dL (ref 6–23)
CALCIUM: 9.6 mg/dL (ref 8.4–10.5)
CO2: 25 mEq/L (ref 19–32)
CREATININE: 0.82 mg/dL (ref 0.50–1.10)
Chloride: 100 mEq/L (ref 96–112)
GFR calc non Af Amer: 75 mL/min — ABNORMAL LOW (ref >90)
GFR, EST AFRICAN AMERICAN: 87 mL/min — AB (ref >90)
Glucose, Bld: 235 mg/dL — ABNORMAL HIGH (ref 70–99)
Potassium: 4 mEq/L (ref 3.7–5.3)
Sodium: 140 mEq/L (ref 137–147)

## 2014-02-11 LAB — URINALYSIS, ROUTINE W REFLEX MICROSCOPIC
BILIRUBIN URINE: NEGATIVE
GLUCOSE, UA: NEGATIVE mg/dL
KETONES UR: NEGATIVE mg/dL
LEUKOCYTES UA: NEGATIVE
Nitrite: NEGATIVE
PH: 5.5 (ref 5.0–8.0)
PROTEIN: NEGATIVE mg/dL
Specific Gravity, Urine: 1.015 (ref 1.005–1.030)
Urobilinogen, UA: 0.2 mg/dL (ref 0.0–1.0)

## 2014-02-11 LAB — CBC
HCT: 36.3 % (ref 36.0–46.0)
Hemoglobin: 12.1 g/dL (ref 12.0–15.0)
MCH: 28.5 pg (ref 26.0–34.0)
MCHC: 33.3 g/dL (ref 30.0–36.0)
MCV: 85.4 fL (ref 78.0–100.0)
PLATELETS: 285 10*3/uL (ref 150–400)
RBC: 4.25 MIL/uL (ref 3.87–5.11)
RDW: 14.4 % (ref 11.5–15.5)
WBC: 6 10*3/uL (ref 4.0–10.5)

## 2014-02-11 LAB — I-STAT TROPONIN, ED: TROPONIN I, POC: 0.01 ng/mL (ref 0.00–0.08)

## 2014-02-11 MED ORDER — ACETAMINOPHEN 325 MG PO TABS
650.0000 mg | ORAL_TABLET | Freq: Once | ORAL | Status: AC
  Administered 2014-02-11: 650 mg via ORAL
  Filled 2014-02-11: qty 2

## 2014-02-11 NOTE — ED Notes (Addendum)
Pt. reports mid chest pain with dizziness , chills and fever onset this evening , denies SOB or cough , tachycardic and febrile  at triage .

## 2014-02-12 ENCOUNTER — Emergency Department (HOSPITAL_COMMUNITY): Payer: BC Managed Care – PPO

## 2014-02-12 ENCOUNTER — Emergency Department (HOSPITAL_COMMUNITY)
Admission: EM | Admit: 2014-02-12 | Discharge: 2014-02-12 | Disposition: A | Discharge status: Home / Self Care | Payer: BC Managed Care – PPO | Attending: Emergency Medicine | Admitting: Emergency Medicine

## 2014-02-12 ENCOUNTER — Encounter (HOSPITAL_COMMUNITY): Payer: Self-pay | Admitting: Radiology

## 2014-02-12 DIAGNOSIS — E041 Nontoxic single thyroid nodule: Secondary | ICD-10-CM

## 2014-02-12 DIAGNOSIS — R079 Chest pain, unspecified: Secondary | ICD-10-CM

## 2014-02-12 LAB — I-STAT TROPONIN, ED: TROPONIN I, POC: 0 ng/mL (ref 0.00–0.08)

## 2014-02-12 LAB — I-STAT CG4 LACTIC ACID, ED: Lactic Acid, Venous: 1.04 mmol/L (ref 0.5–2.2)

## 2014-02-12 LAB — D-DIMER, QUANTITATIVE (NOT AT ARMC): D DIMER QUANT: 1.12 ug{FEU}/mL — AB (ref 0.00–0.48)

## 2014-02-12 MED ORDER — IOHEXOL 350 MG/ML SOLN
100.0000 mL | Freq: Once | INTRAVENOUS | Status: AC | PRN
  Administered 2014-02-12: 100 mL via INTRAVENOUS

## 2014-02-12 NOTE — ED Notes (Signed)
Discharge and follow up instructions reviewed with pt. Pt verbalized understanding.  

## 2014-02-12 NOTE — ED Notes (Signed)
Pt states she began having some central cp with no radiation that started around 6-7pm. Pt states she began having some chills, sob, lightheadedness. Pt described pain as a tightness. Pt rates pain 5/10 at present.

## 2014-02-12 NOTE — Discharge Instructions (Signed)
Your caregiver has diagnosed you as having chest pain that is not specific for one problem, but does not require admission.  Chest pain comes from many different causes.  SEEK IMMEDIATE MEDICAL ATTENTION IF: You have severe chest pain, especially if the pain is crushing or pressure-like and spreads to the arms, back, neck, or jaw, or if you have sweating, nausea (feeling sick to your stomach), or shortness of breath. THIS IS AN EMERGENCY. Don't wait to see if the pain will go away. Get medical help at once. Call 911 or 0 (operator). DO NOT drive yourself to the hospital.  Your chest pain gets worse and does not go away with rest.  You have an attack of chest pain lasting longer than usual, despite rest and treatment with the medications your caregiver has prescribed.  You wake from sleep with chest pain or shortness of breath.  You feel dizzy or faint.  You have chest pain not typical of your usual pain for which you originally saw your caregiver.   PLEASE HAVE FOLLOWUP ULTRASOUND OF YOUR THYROID WITH YOUR PRIMARY DOCTOR IN THE NEXT MONTH

## 2014-02-12 NOTE — ED Provider Notes (Signed)
CSN: [id number]     Arrival date & time 02/11/14  2030 History   First MD Initiated Contact with Patient 02/12/14 0215     Chief Complaint  Patient presents with  . Chest Pain      Patient is a 63 y.o. female presenting with chest pain. The history is provided by the patient.  Chest Pain Pain location:  Substernal area Pain quality: aching and tightness   Pain radiates to:  Does not radiate Pain severity:  Moderate Onset quality:  Gradual Progression:  Resolved Chronicity:  New Worsened by:  Nothing tried Ineffective treatments:  None tried Associated symptoms: fatigue, fever and shortness of breath   Associated symptoms: no abdominal pain, no cough, no syncope and not vomiting   pt presents chest pain.  The episode of CP started at 6pm and lasted for about 6 hours.  It is now resolved upon evaluation.  She also reports associated fever, fatigue,myalgias.  No cough.  No sore throat.  No vomiting.  No abd pain.  She reports mild HA   No h/o CAD/PE/DVT.   She had otherwise been well recently   Fam hx - no siblings/parents with CAD Past Medical History  Diagnosis Date  . Hypertension   . High cholesterol   . Anxiety    Past Surgical History  Procedure Laterality Date  . Cholecystectomy      History  Substance Use Topics  . Smoking status: Never Smoker   . Smokeless tobacco: Not on file  . Alcohol Use: No   OB History   Grav Para Term Preterm Abortions TAB SAB Ect Mult Living                 Review of Systems  Constitutional: Positive for fever, chills and fatigue.  Respiratory: Positive for shortness of breath. Negative for cough.   Cardiovascular: Positive for chest pain. Negative for syncope.  Gastrointestinal: Negative for vomiting, abdominal pain and diarrhea.  Musculoskeletal: Positive for myalgias.  Neurological: Negative for syncope.  All other systems reviewed and are negative.     Allergies  Review of patient's allergies indicates no known  allergies.  Home Medications   Prior to Admission medications   Medication Sig Start Date End Date Taking? Authorizing Provider  Cholecalciferol (VITAMIN D-3 PO) Take 1 capsule by mouth daily.   Yes Historical Provider, MD  ibuprofen (ADVIL,MOTRIN) 200 MG tablet Take 200 mg by mouth every 6 (six) hours as needed.   Yes Historical Provider, MD  Multiple Vitamin (MULTIVITAMIN WITH MINERALS) TABS tablet Take 1 tablet by mouth daily.   Yes Historical Provider, MD  valsartan-hydrochlorothiazide (DIOVAN-HCT) 80-12.5 MG per tablet Take 1 tablet by mouth daily.   Yes Historical Provider, MD   BP 145/73  Pulse 99  Temp(Src) 98.7 F (37.1 C) (Oral)  Resp 23  Ht 5' 5.5" (1.664 m)  Wt 226 lb (102.513 kg)  BMI 37.02 kg/m2  SpO2 98% Physical Exam CONSTITUTIONAL: Well developed/well nourished HEAD: Normocephalic/atraumatic EYES: EOMI/PERRL ENMT: Mucous membranes moist NECK: supple no meningeal signs SPINE:entire spine nontender CV: S1/S2 noted, no murmurs/rubs/gallops noted LUNGS: Lungs are clear to auscultation bilaterally, no apparent distress ABDOMEN: soft, nontender, no rebound or guarding GU:no cva tenderness NEURO: Pt is awake/alert, moves all extremitiesx4 EXTREMITIES: pulses normal, full ROM SKIN: warm, color normal PSYCH: no abnormalities of mood noted  ED Course  Procedures   Pt monitored in the ED for several hours and felt improved She had multiple symptoms (chest pain, documented fever in  the ED, myalgias) For her CP - no signs of PE on CT imaging Aside from risk facotrs of age and HTN, low risk for ACS.  Troponin testing negative in the ED.  Pt had no recurrent CP in the ED, I don't feel admission necessary.   Labs Review Labs Reviewed  BASIC METABOLIC PANEL - Abnormal; Notable for the following:    Glucose, Bld 235 (*)    GFR calc non Af Amer 75 (*)    GFR calc Af Amer 87 (*)    All other components within normal limits  URINALYSIS, ROUTINE W REFLEX MICROSCOPIC -  Abnormal; Notable for the following:    Hgb urine dipstick TRACE (*)    All other components within normal limits  URINE MICROSCOPIC-ADD ON - Abnormal; Notable for the following:    Squamous Epithelial / LPF FEW (*)    All other components within normal limits  D-DIMER, QUANTITATIVE - Abnormal; Notable for the following:    D-Dimer, Quant 1.12 (*)    All other components within normal limits  CBC  I-STAT TROPOININ, ED  I-STAT CG4 LACTIC ACID, ED  Randolm Idol, ED    Imaging Review Dg Chest 2 View  02/11/2014   CLINICAL DATA:  CHEST PAIN  EXAM: CHEST  2 VIEW  COMPARISON:  Prior radiograph from 02/10/2009  FINDINGS: There is accentuation of the cardiac silhouette, likely related to shallow lung inflation. Mediastinal silhouette within normal limits. Tortuosity of the intrathoracic aorta noted.  Lungs are mildly hypoinflated. Diffuse peribronchial thickening present, suggestive of bronchiolitis. There is mild perihilar vascular congestion without pulmonary edema, which may in part be related to shallow lung inflation. No consolidative airspace disease. No pleural effusion or pneumothorax.  No acute osseus abnormality.  IMPRESSION: Mild diffuse bronchitic changes, suggestive of bronchiolitis in the setting of fever. No consolidative airspace disease identified.   Electronically Signed   By: Jeannine Boga M.D.   On: 02/11/2014 21:42   Ct Angio Chest Pe W/cm &/or Wo Cm  02/12/2014   ADDENDUM REPORT: 02/12/2014 06:35  ADDENDUM: 27 mm right thyroid nodule for which follow-up thyroid sonogram on a nonemergent basis is recommended.   Electronically Signed   By: Elon Alas   On: 02/12/2014 06:35   02/12/2014   CLINICAL DATA:  Chest pain, dizziness, chills and fever.  EXAM: CT ANGIOGRAPHY CHEST WITH CONTRAST  TECHNIQUE: Multidetector CT imaging of the chest was performed using the standard protocol during bolus administration of intravenous contrast. Multiplanar CT image reconstructions  and MIPs were obtained to evaluate the vascular anatomy.  CONTRAST:  164mL OMNIPAQUE IOHEXOL 350 MG/ML SOLN  COMPARISON:  Chest radiograph February 11, 2014  FINDINGS: Adequate contrast opacification of the pulmonary artery's. Mild respiratory motion degraded examination. Main pulmonary artery is not enlarged. No pulmonary arterial filling defects to the level of the subsegmental branches.  Heart and pericardium are unremarkable, no right heart strain. Thoracic aorta is normal course and caliber, mild calcific atherosclerosis at the aorta to. No lymphadenopathy by CT size criteria. Tracheobronchial tree is patent, no pneumothorax. No pleural effusions, focal consolidations, pulmonary nodules or masses.  Included view of the abdomen is unremarkable. 27 mm right thyroid nodule. Degenerative thoracic spine.  Review of the MIP images confirms the above findings.  IMPRESSION: Mild respiratory motion degraded examination without convincing evidence of pulmonary embolism nor acute cardiopulmonary process.  Electronically Signed: By: Elon Alas On: 02/12/2014 04:39     EKG Interpretation   Date/Time:  Monday February 11 2014 21:00:48 EDT Ventricular Rate:  120 PR Interval:  138 QRS Duration: 78 QT Interval:  314 QTC Calculation: 443 R Axis:   38 Text Interpretation:  Sinus tachycardia Possible Left atrial enlargement  Borderline ECG Confirmed by Christy Gentles  MD, Elenore Rota ([id number]) on 02/12/2014  12:42:11 AM      MDM   Final diagnoses:  Chest pain, unspecified chest pain type  Thyroid nodule    Nursing notes including past medical history and social history reviewed and considered in documentation xrays reviewed and considered Labs/vital reviewed and considered     Sharyon Cable, MD 02/12/14 [phone]

## 2014-05-20 ENCOUNTER — Encounter (HOSPITAL_COMMUNITY): Payer: Self-pay | Admitting: Pharmacist

## 2014-05-24 NOTE — Patient Instructions (Addendum)
   Your procedure is scheduled on:  Tuesday, Oct 13  Enter through the [employer] [facility] at: 6 AM Pick up the phone at the desk and dial [phone] and inform us of your arrival.  Please call this number if you have any problems the morning of surgery: [phone]  Remember: Do not eat or drink after midnight: Monday Take these medicines the morning of surgery with a SIP OF WATER: valsartan-hydrochlorothiazide   Do not wear jewelry, make-up, or FINGER nail polish No metal in your hair or on your body. Do not wear lotions, powders, perfumes.  You may wear deodorant.  Do not bring valuables to the hospital. Contacts, dentures or bridgework may not be worn into surgery.  Leave suitcase in the car. After Surgery it may be brought to your room. For patients being admitted to the hospital, checkout time is 11:00am the day of discharge.  Home with Cheri Guppy cell [phone]

## 2014-05-27 ENCOUNTER — Encounter (HOSPITAL_COMMUNITY): Payer: Self-pay

## 2014-05-27 ENCOUNTER — Encounter (HOSPITAL_COMMUNITY)
Admission: RE | Admit: 2014-05-27 | Discharge: 2014-05-27 | Disposition: A | Discharge status: Home / Self Care | Payer: BC Managed Care – PPO | Source: Ambulatory Visit | Attending: Obstetrics and Gynecology | Admitting: Obstetrics and Gynecology

## 2014-05-27 DIAGNOSIS — E780 Pure hypercholesterolemia: Secondary | ICD-10-CM | POA: Insufficient documentation

## 2014-05-27 DIAGNOSIS — F419 Anxiety disorder, unspecified: Secondary | ICD-10-CM | POA: Insufficient documentation

## 2014-05-27 DIAGNOSIS — M13862 Other specified arthritis, left knee: Secondary | ICD-10-CM | POA: Insufficient documentation

## 2014-05-27 DIAGNOSIS — M13861 Other specified arthritis, right knee: Secondary | ICD-10-CM | POA: Diagnosis not present

## 2014-05-27 DIAGNOSIS — Z0000 Encounter for general adult medical examination without abnormal findings: Secondary | ICD-10-CM | POA: Diagnosis present

## 2014-05-27 DIAGNOSIS — I10 Essential (primary) hypertension: Secondary | ICD-10-CM | POA: Diagnosis not present

## 2014-05-27 DIAGNOSIS — K219 Gastro-esophageal reflux disease without esophagitis: Secondary | ICD-10-CM | POA: Diagnosis not present

## 2014-05-27 HISTORY — DX: Unspecified osteoarthritis, unspecified site: M19.90

## 2014-05-27 HISTORY — DX: Gastro-esophageal reflux disease without esophagitis: K21.9

## 2014-05-27 HISTORY — DX: Benign neoplasm of connective and other soft tissue, unspecified: D21.9

## 2014-05-27 LAB — BASIC METABOLIC PANEL
Anion gap: 11 (ref 5–15)
BUN: 12 mg/dL (ref 6–23)
CALCIUM: 9.4 mg/dL (ref 8.4–10.5)
CO2: 31 mEq/L (ref 19–32)
Chloride: 100 mEq/L (ref 96–112)
Creatinine, Ser: 0.84 mg/dL (ref 0.50–1.10)
GFR calc non Af Amer: 73 mL/min — ABNORMAL LOW (ref >90)
GFR, EST AFRICAN AMERICAN: 85 mL/min — AB (ref >90)
Glucose, Bld: 125 mg/dL — ABNORMAL HIGH (ref 70–99)
POTASSIUM: 4.6 meq/L (ref 3.7–5.3)
SODIUM: 142 meq/L (ref 137–147)

## 2014-05-27 LAB — CBC
HCT: 38.6 % (ref 36.0–46.0)
Hemoglobin: 13 g/dL (ref 12.0–15.0)
MCH: 29.2 pg (ref 26.0–34.0)
MCHC: 33.7 g/dL (ref 30.0–36.0)
MCV: 86.7 fL (ref 78.0–100.0)
PLATELETS: 355 10*3/uL (ref 150–400)
RBC: 4.45 MIL/uL (ref 3.87–5.11)
RDW: 14.2 % (ref 11.5–15.5)
WBC: 4.7 10*3/uL (ref 4.0–10.5)

## 2014-05-30 NOTE — H&P (Signed)
[name] is an 64 y.o. female with symptomatic fibroids presents for surgical mngt    Menstrual History: No LMP recorded. Patient is postmenopausal.    Past Medical History  Diagnosis Date  . Hypertension   . High cholesterol     diet controlled - no meds  . Anxiety   . SVD (spontaneous vaginal delivery)     x 2  . GERD (gastroesophageal reflux disease)     occas.diet contolled - no meds  . Arthritis     knees  . Fibroids     Past Surgical History  Procedure Laterality Date  . Cholecystectomy    . Colonoscopy      No family history on file.  Social History:  reports that she has never smoked. She has never used smokeless tobacco. She reports that she does not drink alcohol or use illicit drugs.  Allergies: No Known Allergies  No prescriptions prior to admission    ROS  There were no vitals taken for this visit. Physical Exam Gen - NAD Abd - soft, NT/ND CV- RRR Lungs - clear Ext - no edema PV - uterus 16-18 week size  [place]:  Multiple fibroids 5.6-6.3 cm.  No adnexal masses  Assessment/Plan:  Fibroids TAH/BSO  [name] 05/30/2014, 1:35 PM

## 2014-06-04 ENCOUNTER — Encounter (HOSPITAL_COMMUNITY): Admission: RE | Payer: Self-pay | Source: Ambulatory Visit

## 2014-06-04 ENCOUNTER — Inpatient Hospital Stay (HOSPITAL_COMMUNITY)
Admission: RE | Admit: 2014-06-04 | Payer: BC Managed Care – PPO | Source: Ambulatory Visit | Admitting: Obstetrics and Gynecology

## 2014-06-04 SURGERY — HYSTERECTOMY, ABDOMINAL
Anesthesia: Choice

## 2014-08-19 ENCOUNTER — Encounter (HOSPITAL_COMMUNITY): Payer: Self-pay | Admitting: Emergency Medicine

## 2014-08-19 ENCOUNTER — Inpatient Hospital Stay (HOSPITAL_COMMUNITY)
Admission: EM | Admit: 2014-08-19 | Discharge: 2014-08-22 | DRG: 392 | Disposition: A | Discharge status: Home / Self Care | Payer: BC Managed Care – PPO | Attending: Internal Medicine | Admitting: Internal Medicine

## 2014-08-19 ENCOUNTER — Emergency Department (HOSPITAL_COMMUNITY): Payer: BC Managed Care – PPO

## 2014-08-19 DIAGNOSIS — F419 Anxiety disorder, unspecified: Secondary | ICD-10-CM | POA: Diagnosis present

## 2014-08-19 DIAGNOSIS — E785 Hyperlipidemia, unspecified: Secondary | ICD-10-CM | POA: Diagnosis present

## 2014-08-19 DIAGNOSIS — K868 Other specified diseases of pancreas: Secondary | ICD-10-CM | POA: Diagnosis present

## 2014-08-19 DIAGNOSIS — K5712 Diverticulitis of small intestine without perforation or abscess without bleeding: Secondary | ICD-10-CM

## 2014-08-19 DIAGNOSIS — M1990 Unspecified osteoarthritis, unspecified site: Secondary | ICD-10-CM | POA: Diagnosis present

## 2014-08-19 DIAGNOSIS — R1030 Lower abdominal pain, unspecified: Secondary | ICD-10-CM

## 2014-08-19 DIAGNOSIS — K8689 Other specified diseases of pancreas: Secondary | ICD-10-CM | POA: Diagnosis present

## 2014-08-19 DIAGNOSIS — K869 Disease of pancreas, unspecified: Secondary | ICD-10-CM | POA: Diagnosis not present

## 2014-08-19 DIAGNOSIS — K219 Gastro-esophageal reflux disease without esophagitis: Secondary | ICD-10-CM | POA: Diagnosis present

## 2014-08-19 DIAGNOSIS — K5732 Diverticulitis of large intestine without perforation or abscess without bleeding: Secondary | ICD-10-CM | POA: Diagnosis not present

## 2014-08-19 DIAGNOSIS — I10 Essential (primary) hypertension: Secondary | ICD-10-CM | POA: Diagnosis present

## 2014-08-19 HISTORY — DX: Headache: R51

## 2014-08-19 HISTORY — DX: Headache, unspecified: R51.9

## 2014-08-19 HISTORY — DX: Other specified anxiety disorders: F41.8

## 2014-08-19 LAB — CBC WITH DIFFERENTIAL/PLATELET
BASOS ABS: 0 10*3/uL (ref 0.0–0.1)
Basophils Relative: 1 % (ref 0–1)
EOS ABS: 0.1 10*3/uL (ref 0.0–0.7)
EOS PCT: 2 % (ref 0–5)
HCT: 36.6 % (ref 36.0–46.0)
Hemoglobin: 12 g/dL (ref 12.0–15.0)
LYMPHS ABS: 1.6 10*3/uL (ref 0.7–4.0)
LYMPHS PCT: 29 % (ref 12–46)
MCH: 28.2 pg (ref 26.0–34.0)
MCHC: 32.8 g/dL (ref 30.0–36.0)
MCV: 85.9 fL (ref 78.0–100.0)
Monocytes Absolute: 0.4 10*3/uL (ref 0.1–1.0)
Monocytes Relative: 7 % (ref 3–12)
Neutro Abs: 3.4 10*3/uL (ref 1.7–7.7)
Neutrophils Relative %: 61 % (ref 43–77)
PLATELETS: 311 10*3/uL (ref 150–400)
RBC: 4.26 MIL/uL (ref 3.87–5.11)
RDW: 15 % (ref 11.5–15.5)
WBC: 5.4 10*3/uL (ref 4.0–10.5)

## 2014-08-19 LAB — COMPREHENSIVE METABOLIC PANEL
ALK PHOS: 74 U/L (ref 39–117)
ALT: 17 U/L (ref 0–35)
AST: 17 U/L (ref 0–37)
Albumin: 3.2 g/dL — ABNORMAL LOW (ref 3.5–5.2)
Anion gap: 13 (ref 5–15)
BUN: 13 mg/dL (ref 6–23)
CALCIUM: 9.1 mg/dL (ref 8.4–10.5)
CO2: 22 mmol/L (ref 19–32)
Chloride: 105 mEq/L (ref 96–112)
Creatinine, Ser: 0.83 mg/dL (ref 0.50–1.10)
GFR calc Af Amer: 85 mL/min — ABNORMAL LOW (ref >90)
GFR calc non Af Amer: 73 mL/min — ABNORMAL LOW (ref >90)
Glucose, Bld: 141 mg/dL — ABNORMAL HIGH (ref 70–99)
Potassium: 4.1 mmol/L (ref 3.5–5.1)
Sodium: 140 mmol/L (ref 135–145)
TOTAL PROTEIN: 7.1 g/dL (ref 6.0–8.3)
Total Bilirubin: 0.4 mg/dL (ref 0.3–1.2)

## 2014-08-19 LAB — URINALYSIS, ROUTINE W REFLEX MICROSCOPIC
Bilirubin Urine: NEGATIVE
Glucose, UA: NEGATIVE mg/dL
KETONES UR: NEGATIVE mg/dL
LEUKOCYTES UA: NEGATIVE
Nitrite: NEGATIVE
PH: 5 (ref 5.0–8.0)
Protein, ur: NEGATIVE mg/dL
Specific Gravity, Urine: 1.007 (ref 1.005–1.030)
Urobilinogen, UA: 0.2 mg/dL (ref 0.0–1.0)

## 2014-08-19 LAB — URINE MICROSCOPIC-ADD ON

## 2014-08-19 LAB — I-STAT CG4 LACTIC ACID, ED: Lactic Acid, Venous: 0.48 mmol/L — ABNORMAL LOW (ref 0.5–2.2)

## 2014-08-19 LAB — LIPASE, BLOOD: Lipase: 21 U/L (ref 11–59)

## 2014-08-19 MED ORDER — ONDANSETRON HCL 4 MG/2ML IJ SOLN
4.0000 mg | Freq: Once | INTRAMUSCULAR | Status: AC
  Administered 2014-08-19: 4 mg via INTRAVENOUS
  Filled 2014-08-19: qty 2

## 2014-08-19 MED ORDER — CIPROFLOXACIN IN D5W 400 MG/200ML IV SOLN
400.0000 mg | Freq: Once | INTRAVENOUS | Status: AC
  Administered 2014-08-20: 400 mg via INTRAVENOUS
  Filled 2014-08-19: qty 200

## 2014-08-19 MED ORDER — MORPHINE SULFATE 4 MG/ML IJ SOLN
4.0000 mg | Freq: Once | INTRAMUSCULAR | Status: AC
  Administered 2014-08-19: 4 mg via INTRAVENOUS
  Filled 2014-08-19: qty 1

## 2014-08-19 MED ORDER — SODIUM CHLORIDE 0.9 % IV BOLUS (SEPSIS)
1000.0000 mL | Freq: Once | INTRAVENOUS | Status: AC
  Administered 2014-08-19: 1000 mL via INTRAVENOUS

## 2014-08-19 MED ORDER — IOHEXOL 300 MG/ML  SOLN
100.0000 mL | Freq: Once | INTRAMUSCULAR | Status: AC | PRN
  Administered 2014-08-19: 100 mL via INTRAVENOUS

## 2014-08-19 MED ORDER — METRONIDAZOLE IN NACL 5-0.79 MG/ML-% IV SOLN
500.0000 mg | Freq: Once | INTRAVENOUS | Status: AC
  Administered 2014-08-20: 500 mg via INTRAVENOUS
  Filled 2014-08-19: qty 100

## 2014-08-19 NOTE — ED Notes (Signed)
Patient transported to CT 

## 2014-08-19 NOTE — ED Provider Notes (Signed)
CSN: [id number]     Arrival date & time 08/19/14  1447 History   First MD Initiated Contact with Patient 08/19/14 1859     Chief Complaint  Patient presents with  . Abdominal Pain     (Consider location/radiation/quality/duration/timing/severity/associated sxs/prior Treatment) HPI Comments: Lower abdominal pain since yesterday associated with fever of 101. Pain is worse after eating. Pain feels similar to previous episodes of diverticulitis. Nausea without vomiting. Denies diarrhea or blood in the stool. No dysuria or hematuria. No vaginal bleeding or discharge. Denies any chest pain or shortness of breath. Pain is worse with palpation and movement. It is better with rest.  The history is provided by the patient and a caregiver.    Past Medical History  Diagnosis Date  . Hypertension   . High cholesterol     diet controlled - no meds  . Anxiety   . SVD (spontaneous vaginal delivery)     x 2  . GERD (gastroesophageal reflux disease)     occas.diet contolled - no meds  . Arthritis     knees  . Fibroids    Past Surgical History  Procedure Laterality Date  . Cholecystectomy    . Colonoscopy     History reviewed. No pertinent family history. History  Substance Use Topics  . Smoking status: Never Smoker   . Smokeless tobacco: Never Used  . Alcohol Use: No   OB History    No data available     Review of Systems  Constitutional: Positive for fever, activity change and appetite change.  HENT: Negative for congestion and rhinorrhea.   Eyes: Negative for visual disturbance.  Respiratory: Negative for cough, chest tightness and shortness of breath.   Cardiovascular: Negative for chest pain.  Gastrointestinal: Positive for nausea, vomiting and abdominal pain.  Genitourinary: Negative for dysuria, hematuria, vaginal bleeding and vaginal discharge.  Musculoskeletal: Negative for myalgias and arthralgias.  Skin: Negative for rash.  Neurological: Positive for weakness. Negative  for dizziness, facial asymmetry and headaches.  A complete 10 system review of systems was obtained and all systems are negative except as noted in the HPI and PMH.      Allergies  Review of patient's allergies indicates no known allergies.  Home Medications   Prior to Admission medications   Medication Sig Start Date End Date Taking? Authorizing Provider  atorvastatin (LIPITOR) 10 MG tablet Take 10 mg by mouth daily.   Yes Historical Provider, MD  Cholecalciferol (VITAMIN D-3 PO) Take 1 capsule by mouth daily.   Yes Historical Provider, MD  ibuprofen (ADVIL,MOTRIN) 200 MG tablet Take 200 mg by mouth every 6 (six) hours as needed.   Yes Historical Provider, MD  Multiple Vitamin (MULTIVITAMIN WITH MINERALS) TABS tablet Take 1 tablet by mouth daily.   Yes Historical Provider, MD  naproxen (NAPROSYN) 250 MG tablet Take 250 mg by mouth 2 (two) times daily with a meal.   Yes Historical Provider, MD  NON FORMULARY Take 1 capsule by mouth daily. [place]   Yes Historical Provider, MD  omega-3 acid ethyl esters (LOVAZA) 1 G capsule Take 1 g by mouth 2 (two) times daily.   Yes Historical Provider, MD  valsartan-hydrochlorothiazide (DIOVAN-HCT) 80-12.5 MG per tablet Take 1 tablet by mouth daily.   Yes Historical Provider, MD   BP 179/100 mmHg  Pulse 82  Temp(Src) 98.5 F (36.9 C) (Oral)  Resp 18  SpO2 98% Physical Exam  Constitutional: She is oriented to person, place, and time. She appears well-developed and  well-nourished. No distress.  HENT:  Head: Normocephalic and atraumatic.  Mouth/Throat: Oropharynx is clear and moist. No oropharyngeal exudate.  Eyes: Conjunctivae and EOM are normal. Pupils are equal, round, and reactive to light.  Neck: Normal range of motion. Neck supple.  No meningismus.  Cardiovascular: Normal rate, regular rhythm, normal heart sounds and intact distal pulses.   No murmur heard. Pulmonary/Chest: Effort normal and breath sounds normal. No respiratory distress.   Abdominal: Soft. There is tenderness. There is guarding. There is no rebound.  Lower abdominal tenderness with guarding. RUQ scar without tenderness  Musculoskeletal: Normal range of motion. She exhibits no edema or tenderness.  No CVA tenderness  Neurological: She is alert and oriented to person, place, and time. No cranial nerve deficit. She exhibits normal muscle tone. Coordination normal.  No ataxia on finger to nose bilaterally. No pronator drift. 5/5 strength throughout. CN 2-12 intact. Negative Romberg. Equal grip strength. Sensation intact. Gait is normal.   Skin: Skin is warm.  Psychiatric: She has a normal mood and affect. Her behavior is normal.  Nursing note and vitals reviewed.   ED Course  Procedures (including critical care time) Labs Review Labs Reviewed  COMPREHENSIVE METABOLIC PANEL - Abnormal; Notable for the following:    Glucose, Bld 141 (*)    Albumin 3.2 (*)    GFR calc non Af Amer 73 (*)    GFR calc Af Amer 85 (*)    All other components within normal limits  URINALYSIS, ROUTINE W REFLEX MICROSCOPIC - Abnormal; Notable for the following:    APPearance CLOUDY (*)    Hgb urine dipstick TRACE (*)    All other components within normal limits  I-STAT CG4 LACTIC ACID, ED - Abnormal; Notable for the following:    Lactic Acid, Venous 0.48 (*)    All other components within normal limits  CBC WITH DIFFERENTIAL  LIPASE, BLOOD  URINE MICROSCOPIC-ADD ON  CANCER ANTIGEN 19-9    Imaging Review Ct Abdomen Pelvis W Contrast  08/20/2014   ADDENDUM REPORT: 08/20/2014 00:05  ADDENDUM: These results were called by telephone at the time of interpretation on 08/19/2014 at 11:55 am to Dr. Ezequiel Essex , who verbally acknowledged these results.   Electronically Signed   By: Julian Hy M.D.   On: 08/20/2014 00:05   08/20/2014   CLINICAL DATA:  Left lower quadrant pain since Friday. Nausea, fever. History of diverticulitis.  EXAM: CT ABDOMEN AND PELVIS WITH CONTRAST   TECHNIQUE: Multidetector CT imaging of the abdomen and pelvis was performed using the standard protocol following bolus administration of intravenous contrast.  CONTRAST:  132mL OMNIPAQUE IOHEXOL 300 MG/ML  SOLN  COMPARISON:  01/14/2012  FINDINGS: Lower chest:  Lung bases are clear.  Hepatobiliary: 6 mm cyst in the medial segment left hepatic lobe (series 21/ image 17).  Status post cholecystectomy. No intrahepatic or extrahepatic ductal dilatation.  Pancreas: Mild enlargement of the pancreatic body with associated dilatation of the distal pancreatic duct in the pancreatic tail (series 201/image 23), worrisome for obstructing pancreatic mass.  No associated vascular invasion.  Spleen: Within normal limits.  Adrenals/Urinary Tract: Adrenal is unremarkable.  Kidneys within normal limits.  No hydronephrosis.  Bladder is within normal limits  Stomach/Bowel: Stomach is unremarkable.  No evidence of bowel obstruction.  Normal appendix.  Colonic diverticulosis, with pericolonic inflammatory changes along the sigmoid colon (series 21/image 23), compatible with sigmoid diverticulitis.  No drainable fluid collection/abscess.  No free air.  Vascular/Lymphatic: Atherosclerotic calcifications of the abdominal  aorta and branch vessels.  Upper abdominal lymphadenopathy, including:  --17 mm short axis portacaval node (series 2/ image 26), previously 12 mm  --9 mm short axis periportal nodes (series 2/ image 23), unchanged  --11 mm short axis gastrohepatic node (series 2/image 20), previously 7 mm  Reproductive: Enlarged, heterogeneous uterus with multiple uterine fibroids.  Bilateral ovaries are grossly unremarkable.  Other: No abdominopelvic ascites.  Musculoskeletal: Degenerative changes of the visualized thoracolumbar spine.  IMPRESSION: Sigmoid diverticulitis. No drainable fluid collection/abscess. No free air.  Mild enlargement of the pancreatic body with associated dilatation of the distal pancreatic duct in the pancreatic  tail, worrisome for obstructing pancreatic mass. Consider EUS with tissue sampling. Alternatively, if further imaging is desired, MRI abdomen with/without contrast can be performed following resolution of the patient's acute symptoms.  No associated vascular invasion.  Upper abdominal lymphadenopathy, possibly reactive.  Electronically Signed: By: Julian Hy M.D. On: 08/19/2014 23:43     EKG Interpretation None      MDM   Final diagnoses:  Lower abdominal pain  Diverticulitis of small intestine without perforation or abscess without bleeding  Pancreatic mass   Lower abdominal pain with fever and vomiting and nausea since last night.   UA negative. Lactate normal. No leukocytosis.  CT with evidence of uncomplicated diverticulitis.  cipro and flagyl started. Incidental pancreatic mass d/w Dr. Maryland Pink and patient.  EUS recommended.  D/w Patient who prefers observation to expedite workup.  Her pain is controlled and she is tolerating PO.  Continue antibiotics for diverticulitis. GI to evaluate in AM.  D/w Dr. Clementeen Graham.   Ezequiel Essex, MD 08/20/14 [id number]

## 2014-08-19 NOTE — ED Notes (Signed)
Pt c/o left lower abd pain with some fever at times and worse after eating; pt sts hx of diverticulitis and unsure if same

## 2014-08-20 ENCOUNTER — Encounter (HOSPITAL_COMMUNITY): Payer: Self-pay | Admitting: General Practice

## 2014-08-20 ENCOUNTER — Inpatient Hospital Stay (HOSPITAL_COMMUNITY): Payer: BC Managed Care – PPO

## 2014-08-20 DIAGNOSIS — K219 Gastro-esophageal reflux disease without esophagitis: Secondary | ICD-10-CM | POA: Diagnosis present

## 2014-08-20 DIAGNOSIS — K869 Disease of pancreas, unspecified: Secondary | ICD-10-CM | POA: Diagnosis present

## 2014-08-20 DIAGNOSIS — K5732 Diverticulitis of large intestine without perforation or abscess without bleeding: Secondary | ICD-10-CM | POA: Diagnosis present

## 2014-08-20 DIAGNOSIS — F419 Anxiety disorder, unspecified: Secondary | ICD-10-CM | POA: Diagnosis present

## 2014-08-20 DIAGNOSIS — E785 Hyperlipidemia, unspecified: Secondary | ICD-10-CM | POA: Diagnosis present

## 2014-08-20 DIAGNOSIS — M1990 Unspecified osteoarthritis, unspecified site: Secondary | ICD-10-CM | POA: Diagnosis present

## 2014-08-20 DIAGNOSIS — K8689 Other specified diseases of pancreas: Secondary | ICD-10-CM | POA: Diagnosis present

## 2014-08-20 DIAGNOSIS — I10 Essential (primary) hypertension: Secondary | ICD-10-CM | POA: Diagnosis present

## 2014-08-20 DIAGNOSIS — K5792 Diverticulitis of intestine, part unspecified, without perforation or abscess without bleeding: Secondary | ICD-10-CM | POA: Insufficient documentation

## 2014-08-20 DIAGNOSIS — K868 Other specified diseases of pancreas: Secondary | ICD-10-CM | POA: Diagnosis present

## 2014-08-20 LAB — CANCER ANTIGEN 19-9: CA 19-9: 1.6 U/mL — ABNORMAL LOW (ref <35.0)

## 2014-08-20 MED ORDER — ACETAMINOPHEN 650 MG RE SUPP: 650.0000 mg | Freq: Four times a day (QID) | RECTAL | Status: DC | PRN

## 2014-08-20 MED ORDER — SODIUM CHLORIDE 0.9 % IV SOLN: INTRAVENOUS | Status: DC

## 2014-08-20 MED ORDER — ONDANSETRON HCL 4 MG/2ML IJ SOLN
4.0000 mg | Freq: Four times a day (QID) | INTRAMUSCULAR | Status: DC | PRN
  Filled 2014-08-20: qty 2

## 2014-08-20 MED ORDER — ONDANSETRON HCL 4 MG PO TABS: 4.0000 mg | ORAL_TABLET | Freq: Four times a day (QID) | ORAL | Status: DC | PRN

## 2014-08-20 MED ORDER — OMEGA-3-ACID ETHYL ESTERS 1 G PO CAPS
1.0000 g | ORAL_CAPSULE | Freq: Two times a day (BID) | ORAL | Status: DC
  Administered 2014-08-20 – 2014-08-21 (×3): 1 g via ORAL
  Filled 2014-08-20 (×7): qty 1

## 2014-08-20 MED ORDER — ATORVASTATIN CALCIUM 10 MG PO TABS
10.0000 mg | ORAL_TABLET | Freq: Every day | ORAL | Status: DC
  Filled 2014-08-20 (×2): qty 1

## 2014-08-20 MED ORDER — VITAMIN D3 25 MCG (1000 UNIT) PO TABS
1000.0000 [IU] | ORAL_TABLET | Freq: Every day | ORAL | Status: DC
  Administered 2014-08-20: 1000 [IU] via ORAL
  Filled 2014-08-20 (×2): qty 1

## 2014-08-20 MED ORDER — ENOXAPARIN SODIUM 40 MG/0.4ML ~~LOC~~ SOLN
40.0000 mg | Freq: Every day | SUBCUTANEOUS | Status: DC
  Administered 2014-08-20 – 2014-08-22 (×3): 40 mg via SUBCUTANEOUS
  Filled 2014-08-20 (×4): qty 0.4

## 2014-08-20 MED ORDER — IRBESARTAN 75 MG PO TABS
75.0000 mg | ORAL_TABLET | Freq: Every day | ORAL | Status: DC
  Filled 2014-08-20 (×2): qty 1

## 2014-08-20 MED ORDER — METRONIDAZOLE IN NACL 5-0.79 MG/ML-% IV SOLN
500.0000 mg | Freq: Three times a day (TID) | INTRAVENOUS | Status: DC
  Administered 2014-08-20 – 2014-08-21 (×4): 500 mg via INTRAVENOUS
  Filled 2014-08-20 (×6): qty 100

## 2014-08-20 MED ORDER — SODIUM CHLORIDE 0.9 % IV SOLN
INTRAVENOUS | Status: DC
  Administered 2014-08-20: 02:00:00 via INTRAVENOUS

## 2014-08-20 MED ORDER — ATORVASTATIN CALCIUM 10 MG PO TABS
10.0000 mg | ORAL_TABLET | Freq: Every day | ORAL | Status: DC
  Administered 2014-08-21: 10 mg via ORAL
  Filled 2014-08-20 (×2): qty 1

## 2014-08-20 MED ORDER — ACETAMINOPHEN 325 MG PO TABS
650.0000 mg | ORAL_TABLET | Freq: Four times a day (QID) | ORAL | Status: DC | PRN
  Administered 2014-08-20 (×3): 650 mg via ORAL
  Filled 2014-08-20 (×3): qty 2

## 2014-08-20 MED ORDER — MORPHINE SULFATE 2 MG/ML IJ SOLN
2.0000 mg | INTRAMUSCULAR | Status: DC | PRN
  Filled 2014-08-20: qty 1

## 2014-08-20 MED ORDER — ADULT MULTIVITAMIN W/MINERALS CH
1.0000 | ORAL_TABLET | Freq: Every day | ORAL | Status: DC
  Administered 2014-08-20 – 2014-08-21 (×2): 1 via ORAL
  Filled 2014-08-20 (×2): qty 1

## 2014-08-20 MED ORDER — IRBESARTAN 150 MG PO TABS
150.0000 mg | ORAL_TABLET | Freq: Every day | ORAL | Status: DC
  Administered 2014-08-21 – 2014-08-22 (×2): 150 mg via ORAL
  Filled 2014-08-20 (×2): qty 1

## 2014-08-20 MED ORDER — VALSARTAN-HYDROCHLOROTHIAZIDE 80-12.5 MG PO TABS: 1.0000 | ORAL_TABLET | Freq: Every day | ORAL | Status: DC

## 2014-08-20 MED ORDER — LORAZEPAM 2 MG/ML IJ SOLN
0.5000 mg | Freq: Once | INTRAMUSCULAR | Status: AC
  Administered 2014-08-20: 0.5 mg via INTRAVENOUS
  Filled 2014-08-20: qty 1

## 2014-08-20 MED ORDER — HYDROCHLOROTHIAZIDE 12.5 MG PO CAPS: 12.5000 mg | ORAL_CAPSULE | Freq: Every day | ORAL | Status: DC

## 2014-08-20 MED ORDER — HYDROCODONE-ACETAMINOPHEN 5-325 MG PO TABS
1.0000 | ORAL_TABLET | ORAL | Status: DC | PRN
  Administered 2014-08-21: 1 via ORAL
  Filled 2014-08-20: qty 1

## 2014-08-20 MED ORDER — CIPROFLOXACIN IN D5W 400 MG/200ML IV SOLN
400.0000 mg | Freq: Two times a day (BID) | INTRAVENOUS | Status: DC
  Administered 2014-08-21: 400 mg via INTRAVENOUS
  Filled 2014-08-20 (×2): qty 200

## 2014-08-20 NOTE — Progress Notes (Signed)
Patient admitted after midnight. Chart reviewed. Patient admitted.  Continue clears, iv abx. No need for GI consult as in patient, as would no do ERCP or EUS while active infection. Will get MRCP. CA 19-9 normal.  Doree Barthel, MD [facility]

## 2014-08-20 NOTE — Progress Notes (Signed)
Pt stuck several times in ED, states that IV in [facility] was uncomfortable. Pt allowed RN to attempt IV placement, IV placed in left hand, good blood return. Pt requested RN remove IV, states it 'must be larger than the one in my arm', advised is was the same size catheter. IV removed, site WNL. IV team consulted.

## 2014-08-20 NOTE — H&P (Signed)
[facility] History and Physical  [name] OXB:[id number] DOB: [birth year] DOA: 08/19/2014  Referring physician: Dr. Wyvonnia Dusky PCP: Netta Cedars, MD   Chief Complaint:  Left-sided abdominal pain since 2  days  HPI:  63 year old female with history of upper tension, hyperlipidemia, anxiety, GERD, arthritis and history of diverticulitis few years back presented with left lower quadrant abdominal pain for past 2 days which has worsened since yesterday and involving left lower quadrant and suprapubic area. Patient reports having cramping abdominal pain which is worse after eating and associated with dry heaving. She reports having a fever of 100 F at home yesterday. Denies any chills, vomiting, chest pain, palpitations, epigastric pain, diarrhea or dysuria. Denies any change in her weight or appetite. Denies frequent abdominal pain,  jaundice or abdominal fullness.  Course in the ED  patient's vitals were stable. Blood work done showed normal CBC and chemistry including LFTs. Lipase was normal. Lactic acid was normal. A CT scan of the abdomen and pelvis with contrast done showed acute sigmoid diverticulitis without any fluid collection or abscess. Also commended on mild enlargement of the pancreatic body with associated dilatation of the distal pancreatic duct in the pancreatic tail worrisome for obstructing pancreatic mass. Recommends EUS with tissue sampling versus MRI abdomen with/without contrast. Also shows upper abdominal lymphadenopathy which is possibly reactive.  Patient was given a dose of IV ciprofloxacin and Flagyl in the ED and hospitalist admission requested. Patient on my evaluation was tearful after being told about the CT results. Her abdominal pain had resolved after receiving pain medication in the ED.  Review of Systems:  Constitutional:  fever+, denies chills, diaphoresis, appetite change and fatigue.  HEENT: Denies visual or hearing symptoms, congestion, trouble  swallowing, neck pain Respiratory: Denies shortness of breath, dyspnea on exertion, chest pressure, cough or wheezing Cardiovascular: Denies chest pain, palpitations and leg swelling.  Gastrointestinal: Nausea, abdominal pain, Denies vomiting, diarrhea, constipation, blood in stool or abdominal distention Genitourinary: Denies dysuria, urgency, frequency, hematuria, flank pain and difficulty urinating.  Endocrine: Denies: hot or cold intolerance, polyuria, polydipsia. Musculoskeletal: Denies myalgias, back pain, joint pain Skin: Denies  rash and wound.  Neurological: Denies dizziness, syncope, weakness, light-headedness, numbness and headaches.  Hematological: Denies adenopathy. Psychiatric/Behavioral: Denies confusion Past Medical History  Diagnosis Date  . Hypertension   . High cholesterol     diet controlled - no meds  . Anxiety   . SVD (spontaneous vaginal delivery)     x 2  . GERD (gastroesophageal reflux disease)     occas.diet contolled - no meds  . Arthritis     knees  . Fibroids    Past Surgical History  Procedure Laterality Date  . Cholecystectomy    . Colonoscopy     Social History:  reports that she has never smoked. She has never used smokeless tobacco. She reports that she does not drink alcohol or use illicit drugs.  No Known Allergies  History reviewed. No pertinent family history.  Prior to Admission medications   Medication Sig Start Date End Date Taking? Authorizing Provider  atorvastatin (LIPITOR) 10 MG tablet Take 10 mg by mouth daily.   Yes Historical Provider, MD  Cholecalciferol (VITAMIN D-3 PO) Take 1 capsule by mouth daily.   Yes Historical Provider, MD  ibuprofen (ADVIL,MOTRIN) 200 MG tablet Take 200 mg by mouth every 6 (six) hours as needed.   Yes Historical Provider, MD  Multiple Vitamin (MULTIVITAMIN WITH MINERALS) TABS tablet Take 1 tablet by mouth daily.   Yes  Historical Provider, MD  naproxen (NAPROSYN) 250 MG tablet Take 250 mg by mouth 2  (two) times daily with a meal.   Yes Historical Provider, MD  NON FORMULARY Take 1 capsule by mouth daily. Niagen   Yes Historical Provider, MD  omega-3 acid ethyl esters (LOVAZA) 1 G capsule Take 1 g by mouth 2 (two) times daily.   Yes Historical Provider, MD  valsartan-hydrochlorothiazide (DIOVAN-HCT) 80-12.5 MG per tablet Take 1 tablet by mouth daily.   Yes Historical Provider, MD     Physical Exam:  Filed Vitals:   08/19/14 2330 08/19/14 2345 08/20/14 0015 08/20/14 0116  BP: 146/71 148/69 176/86 170/107  Pulse: 72 69 74 77  Temp:      TempSrc:      Resp:    16  SpO2: 97% 100% 99% 97%    Constitutional: Vital signs reviewed.  Patient is a well-developed and well-nourished in no acute distress. HEENT: no pallor, no icterus, moist oral mucosa, no cervical lymphadenopathy Cardiovascular: RRR, S1 normal, S2 normal, no MRG Chest: CTAB, no wheezes, rales, or rhonchi Abdominal: Soft.  non-distended, bowel sounds are normal, nontender (earlier reports having severe left lower quadrant tenderness resolved after receiving pain medications in the ED) Ext: warm, no edema Neurological: Alert and oriented  Labs on Admission:  Basic Metabolic Panel:  Recent Labs Lab 08/19/14 1532  NA 140  K 4.1  CL 105  CO2 22  GLUCOSE 141*  BUN 13  CREATININE 0.83  CALCIUM 9.1   Liver Function Tests:  Recent Labs Lab 08/19/14 1532  AST 17  ALT 17  ALKPHOS 74  BILITOT 0.4  PROT 7.1  ALBUMIN 3.2*    Recent Labs Lab 08/19/14 1532  LIPASE 21   No results for input(s): AMMONIA in the last 168 hours. CBC:  Recent Labs Lab 08/19/14 1532  WBC 5.4  NEUTROABS 3.4  HGB 12.0  HCT 36.6  MCV 85.9  PLT 311   Cardiac Enzymes: No results for input(s): CKTOTAL, CKMB, CKMBINDEX, TROPONINI in the last 168 hours. BNP: Invalid input(s): POCBNP CBG: No results for input(s): GLUCAP in the last 168 hours.  Radiological Exams on Admission: Ct Abdomen Pelvis W Contrast  08/20/2014    ADDENDUM REPORT: 08/20/2014 00:05  ADDENDUM: These results were called by telephone at the time of interpretation on 08/19/2014 at 11:55 am to Dr. Ezequiel Essex , who verbally acknowledged these results.   Electronically Signed   By: Julian Hy M.D.   On: 08/20/2014 00:05   08/20/2014   CLINICAL DATA:  Left lower quadrant pain since Friday. Nausea, fever. History of diverticulitis.  EXAM: CT ABDOMEN AND PELVIS WITH CONTRAST  TECHNIQUE: Multidetector CT imaging of the abdomen and pelvis was performed using the standard protocol following bolus administration of intravenous contrast.  CONTRAST:  115mL OMNIPAQUE IOHEXOL 300 MG/ML  SOLN  COMPARISON:  01/14/2012  FINDINGS: Lower chest:  Lung bases are clear.  Hepatobiliary: 6 mm cyst in the medial segment left hepatic lobe (series 21/ image 17).  Status post cholecystectomy. No intrahepatic or extrahepatic ductal dilatation.  Pancreas: Mild enlargement of the pancreatic body with associated dilatation of the distal pancreatic duct in the pancreatic tail (series 201/image 23), worrisome for obstructing pancreatic mass.  No associated vascular invasion.  Spleen: Within normal limits.  Adrenals/Urinary Tract: Adrenal is unremarkable.  Kidneys within normal limits.  No hydronephrosis.  Bladder is within normal limits  Stomach/Bowel: Stomach is unremarkable.  No evidence of bowel obstruction.  Normal appendix.  Colonic diverticulosis, with pericolonic inflammatory changes along the sigmoid colon (series 21/image 23), compatible with sigmoid diverticulitis.  No drainable fluid collection/abscess.  No free air.  Vascular/Lymphatic: Atherosclerotic calcifications of the abdominal aorta and branch vessels.  Upper abdominal lymphadenopathy, including:  --17 mm short axis portacaval node (series 2/ image 26), previously 12 mm  --9 mm short axis periportal nodes (series 2/ image 23), unchanged  --11 mm short axis gastrohepatic node (series 2/image 20), previously 7 mm   Reproductive: Enlarged, heterogeneous uterus with multiple uterine fibroids.  Bilateral ovaries are grossly unremarkable.  Other: No abdominopelvic ascites.  Musculoskeletal: Degenerative changes of the visualized thoracolumbar spine.  IMPRESSION: Sigmoid diverticulitis. No drainable fluid collection/abscess. No free air.  Mild enlargement of the pancreatic body with associated dilatation of the distal pancreatic duct in the pancreatic tail, worrisome for obstructing pancreatic mass. Consider EUS with tissue sampling. Alternatively, if further imaging is desired, MRI abdomen with/without contrast can be performed following resolution of the patient's acute symptoms.  No associated vascular invasion.  Upper abdominal lymphadenopathy, possibly reactive.  Electronically Signed: By: Julian Hy M.D. On: 08/19/2014 23:43      Assessment/Plan  Principal Problem:   Sigmoid diverticulitis Admit to medical floor -Continue empiric ciprofloxacin and Flagyl. Supportive care with gentle IV hydration, pain control with prn vicodin and IV morphine when necessary for severe symptoms. Start on clear liquids. Supportive care with Tylenol and antiemetics.  Active Problems:   Pancreatic mass Incidental finding seen on CT scan as above. Patient reports limited smoking for a few years and quit since almost 30 years now. Denies alcohol use. Denies abdominal fullness or weight loss symptoms. Denies family history of pancreatic cancer. -Check CA 19-9. Please consult GI in the morning for evaluation and further recommendations. (EUS versus MRI abdomen). Given her acute diverticulitis symptoms workup will likely be as outpatient once current symptoms resolved.    Essential hypertension Resume home blood pressure medications    Anxiety Resume home medications    GERD (gastroesophageal reflux disease) Continue Protonix    Hyperlipidemia Continue Lipitor and lovaza    Diet: clear liquid  DVT prophylaxis: sq  lovenox   Code Status: Full code Family Communication: discussed with patient at bedside Disposition Plan: Home once clinically improved  Alexanderia Gorby, [facility] Pager [phone]  Total time spent on admission :70 minutes  If 7PM-7AM, please contact night-coverage [email or web address] Password [email or web address] 08/20/2014, 1:35 AM

## 2014-08-20 NOTE — Progress Notes (Signed)
Pt admitted to 6N21 from ED. Pt is alert and oriented x4, able to follow commands, denies any pain at this time. VSS, no s/s of respiratory distress on room air. Standard hospital orientation completed, bed in lowest position, call bell is within reach. Will continue to monitor pt closely.

## 2014-08-20 NOTE — ED Notes (Signed)
Attempted to give report 

## 2014-08-21 MED ORDER — GADOBENATE DIMEGLUMINE 529 MG/ML IV SOLN
20.0000 mL | Freq: Once | INTRAVENOUS | Status: AC | PRN
  Administered 2014-08-21: 20 mL via INTRAVENOUS

## 2014-08-21 MED ORDER — METRONIDAZOLE 500 MG PO TABS
500.0000 mg | ORAL_TABLET | Freq: Three times a day (TID) | ORAL | Status: DC
  Administered 2014-08-21 – 2014-08-22 (×3): 500 mg via ORAL
  Filled 2014-08-21 (×6): qty 1

## 2014-08-21 MED ORDER — LORAZEPAM 0.5 MG PO TABS
0.5000 mg | ORAL_TABLET | Freq: Two times a day (BID) | ORAL | Status: DC | PRN
  Administered 2014-08-21: 0.5 mg via ORAL
  Filled 2014-08-21: qty 1

## 2014-08-21 MED ORDER — SODIUM CHLORIDE 0.9 % IV SOLN: 250.0000 mL | INTRAVENOUS | Status: DC | PRN

## 2014-08-21 MED ORDER — CIPROFLOXACIN HCL 500 MG PO TABS
500.0000 mg | ORAL_TABLET | Freq: Two times a day (BID) | ORAL | Status: DC
  Administered 2014-08-21 – 2014-08-22 (×2): 500 mg via ORAL
  Filled 2014-08-21 (×4): qty 1

## 2014-08-21 MED ORDER — SODIUM CHLORIDE 0.9 % IJ SOLN
3.0000 mL | Freq: Two times a day (BID) | INTRAMUSCULAR | Status: DC
  Administered 2014-08-21 – 2014-08-22 (×2): 3 mL via INTRAVENOUS

## 2014-08-21 MED ORDER — SODIUM CHLORIDE 0.9 % IJ SOLN: 3.0000 mL | INTRAMUSCULAR | Status: DC | PRN

## 2014-08-21 NOTE — Progress Notes (Signed)
[facility] PROGRESS NOTE  Clora Ohmer RCB:[id number] DOB: 01/11/1951 DOA: 08/19/2014 PCP: Netta Cedars, MD  Assessment/Plan:  Principal Problem:   Sigmoid diverticulitis: improving. Advance to low residue. Change to po abx. Saline lock Active Problems:   Pancreatic mass: MRCP concerning for cancer.  Discussed with Dr. Paulita Fujita.  He will schedule EUS next week   Essential hypertension    GERD (gastroesophageal reflux disease)   Hyperlipidemia  HPI/Subjective: Pain improved 1/10. No n/v  Objective: Filed Vitals:   08/21/14 0610  BP: 141/82  Pulse: 72  Temp: 97.7 F (36.5 C)  Resp: 18    Intake/Output Summary (Last 24 hours) at 08/21/14 1411 Last data filed at 08/21/14 8032  Gross per 24 hour  Intake 1339.17 ml  Output      0 ml  Net 1339.17 ml   Filed Weights   08/21/14 0700  Weight: 101.606 kg (224 lb)    Exam:   General:  comfortable  Cardiovascular: RRR without MGR  Respiratory: CTA without WRR  Abdomen: S, NT, ND  Ext: no CCE  Basic Metabolic Panel:  Recent Labs Lab 08/19/14 1532  NA 140  K 4.1  CL 105  CO2 22  GLUCOSE 141*  BUN 13  CREATININE 0.83  CALCIUM 9.1   Liver Function Tests:  Recent Labs Lab 08/19/14 1532  AST 17  ALT 17  ALKPHOS 74  BILITOT 0.4  PROT 7.1  ALBUMIN 3.2*    Recent Labs Lab 08/19/14 1532  LIPASE 21   No results for input(s): AMMONIA in the last 168 hours. CBC:  Recent Labs Lab 08/19/14 1532  WBC 5.4  NEUTROABS 3.4  HGB 12.0  HCT 36.6  MCV 85.9  PLT 311   Cardiac Enzymes: No results for input(s): CKTOTAL, CKMB, CKMBINDEX, TROPONINI in the last 168 hours. BNP (last 3 results) No results for input(s): PROBNP in the last 8760 hours. CBG: No results for input(s): GLUCAP in the last 168 hours.  No results found for this or any previous visit (from the past 240 hour(s)).   Studies: Ct Abdomen Pelvis W Contrast  08/20/2014   ADDENDUM REPORT: 08/20/2014 00:05  ADDENDUM: These  results were called by telephone at the time of interpretation on 08/19/2014 at 11:55 am to Dr. Ezequiel Essex , who verbally acknowledged these results.   Electronically Signed   By: Julian Hy M.D.   On: 08/20/2014 00:05   08/20/2014   CLINICAL DATA:  Left lower quadrant pain since Friday. Nausea, fever. History of diverticulitis.  EXAM: CT ABDOMEN AND PELVIS WITH CONTRAST  TECHNIQUE: Multidetector CT imaging of the abdomen and pelvis was performed using the standard protocol following bolus administration of intravenous contrast.  CONTRAST:  125mL OMNIPAQUE IOHEXOL 300 MG/ML  SOLN  COMPARISON:  01/14/2012  FINDINGS: Lower chest:  Lung bases are clear.  Hepatobiliary: 6 mm cyst in the medial segment left hepatic lobe (series 21/ image 17).  Status post cholecystectomy. No intrahepatic or extrahepatic ductal dilatation.  Pancreas: Mild enlargement of the pancreatic body with associated dilatation of the distal pancreatic duct in the pancreatic tail (series 201/image 23), worrisome for obstructing pancreatic mass.  No associated vascular invasion.  Spleen: Within normal limits.  Adrenals/Urinary Tract: Adrenal is unremarkable.  Kidneys within normal limits.  No hydronephrosis.  Bladder is within normal limits  Stomach/Bowel: Stomach is unremarkable.  No evidence of bowel obstruction.  Normal appendix.  Colonic diverticulosis, with pericolonic inflammatory changes along the sigmoid colon (series 21/image 23), compatible with sigmoid diverticulitis.  No drainable fluid collection/abscess.  No free air.  Vascular/Lymphatic: Atherosclerotic calcifications of the abdominal aorta and branch vessels.  Upper abdominal lymphadenopathy, including:  --17 mm short axis portacaval node (series 2/ image 26), previously 12 mm  --9 mm short axis periportal nodes (series 2/ image 23), unchanged  --11 mm short axis gastrohepatic node (series 2/image 20), previously 7 mm  Reproductive: Enlarged, heterogeneous uterus with  multiple uterine fibroids.  Bilateral ovaries are grossly unremarkable.  Other: No abdominopelvic ascites.  Musculoskeletal: Degenerative changes of the visualized thoracolumbar spine.  IMPRESSION: Sigmoid diverticulitis. No drainable fluid collection/abscess. No free air.  Mild enlargement of the pancreatic body with associated dilatation of the distal pancreatic duct in the pancreatic tail, worrisome for obstructing pancreatic mass. Consider EUS with tissue sampling. Alternatively, if further imaging is desired, MRI abdomen with/without contrast can be performed following resolution of the patient's acute symptoms.  No associated vascular invasion.  Upper abdominal lymphadenopathy, possibly reactive.  Electronically Signed: By: Julian Hy M.D. On: 08/19/2014 23:43   Mr Jeananne Rama W/wo Cm/mrcp  08/21/2014   CLINICAL DATA:  Pancreatic mass seen on recent CT. Left lower quadrant pain.  EXAM: MRI ABDOMEN WITHOUT AND WITH CONTRAST (INCLUDING MRCP)  TECHNIQUE: Multiplanar multisequence MR imaging of the abdomen was performed both before and after the administration of intravenous contrast. Heavily T2-weighted images of the biliary and pancreatic ducts were obtained, and three-dimensional MRCP images were rendered by post processing.  CONTRAST:  75mL MULTIHANCE GADOBENATE DIMEGLUMINE 529 MG/ML IV SOLN  COMPARISON:  CT on 08/19/2014  FINDINGS: Lower chest:  Unremarkable.  Liver: Scattered tiny sub-cm hepatic cysts are noted, however no liver masses are identified. Mild hepatic steatosis also demonstrated.  Pancreas: A solid enhancing mass is seen in the pancreatic body which measures 2.5 x 3.2 cm on image 52 of series 1303. This mass appears to involve both the proximal splenic artery and vein on image 53 of series 1301 and 54 of series 1303, but does not involve the celiac trunk or superior mesenteric artery.  MRCP - Biliary and pancreatic ducts: No evidence of biliary ductal dilatation or choledocholithiasis. The  main pancreatic duct is dilated in the pancreatic tail, proximal to the pancreatic body mass.  Spleen:  Within normal limits in size and appearance.  Adrenal Glands:  No masses identified.  Kidneys: No complex cystic or solid masses identified. No evidence of urolithiasis or hydronephrosis.  Stomach/Bowel/Peritoneum: No evidence of wall thickening, mass, or obstruction involving visualized abdominal bowel.  Vascular/Lymphatic: Mild peripancreatic lymphadenopathy is seen, with largest lymph node in the portacaval space measuring 15 mm.  Other:  None.  Musculoskeletal:  No suspicious bone lesions identified.  IMPRESSION: 3.2 cm solid mass in the pancreatic body causing upstream pancreatic ductal dilatation, highly suspicious for pancreatic carcinoma. This mass appears to involve the proximal splenic artery and vein. Consider EUS for tissue diagnosis.  Mild peripancreatic lymphadenopathy, suspicious for metastatic disease.  No evidence of liver metastases or biliary ductal dilatation.   Electronically Signed   By: Earle Gell M.D.   On: 08/21/2014 08:47    Scheduled Meds: . atorvastatin  10 mg Oral Daily  . ciprofloxacin  400 mg Intravenous Q12H  . enoxaparin (LOVENOX) injection  40 mg Subcutaneous Daily  . irbesartan  150 mg Oral Daily  . metronidazole  500 mg Intravenous Q8H  . multivitamin with minerals  1 tablet Oral Daily  . omega-3 acid ethyl esters  1 g Oral BID   Continuous Infusions: . sodium  chloride 50 mL/hr at 08/20/14 1726    Time spent: 35 minutes  [facility]  [email or web address], password [email or web address] 08/21/2014, 2:11 PM  LOS: 2 days

## 2014-08-22 DIAGNOSIS — K219 Gastro-esophageal reflux disease without esophagitis: Secondary | ICD-10-CM

## 2014-08-22 DIAGNOSIS — F419 Anxiety disorder, unspecified: Secondary | ICD-10-CM

## 2014-08-22 MED ORDER — HYDROCODONE-ACETAMINOPHEN 5-325 MG PO TABS: 1.0000 | ORAL_TABLET | ORAL | Status: DC | PRN

## 2014-08-22 MED ORDER — PANTOPRAZOLE SODIUM 40 MG PO TBEC: 40.0000 mg | DELAYED_RELEASE_TABLET | Freq: Every day | ORAL | Status: DC

## 2014-08-22 MED ORDER — CIPROFLOXACIN HCL 500 MG PO TABS: 500.0000 mg | ORAL_TABLET | Freq: Two times a day (BID) | ORAL | Status: DC

## 2014-08-22 MED ORDER — PROMETHAZINE HCL 12.5 MG PO TABS: 12.5000 mg | ORAL_TABLET | Freq: Four times a day (QID) | ORAL | Status: DC | PRN

## 2014-08-22 MED ORDER — ACETAMINOPHEN 325 MG PO TABS: 650.0000 mg | ORAL_TABLET | Freq: Four times a day (QID) | ORAL | Status: DC | PRN

## 2014-08-22 MED ORDER — LORAZEPAM 0.5 MG PO TABS: 0.5000 mg | ORAL_TABLET | Freq: Two times a day (BID) | ORAL | Status: DC | PRN

## 2014-08-22 MED ORDER — METRONIDAZOLE 500 MG PO TABS: 500.0000 mg | ORAL_TABLET | Freq: Three times a day (TID) | ORAL | Status: DC

## 2014-08-22 NOTE — Discharge Summary (Signed)
Physician Discharge Summary  [name] WPY:[id number] DOB: Sep 07, 1950 DOA: 08/19/2014  PCP: Netta Cedars, MD  Admit date: 08/19/2014 Discharge date: 08/22/2014  Time spent: greater than 30 min  Recommendations for Outpatient Follow-up:  1. outpt EUS next week with Dr. Paulita Fujita  Discharge Diagnoses:  Principal Problem:   Sigmoid diverticulitis Active Problems:   Pancreatic mass   Essential hypertension   Anxiety   GERD (gastroesophageal reflux disease)   Hyperlipidemia   Discharge Condition: stable  Diet recommendation: low fiber for a few weeks, then hearth healthy high fiber  Filed Weights   08/21/14 0700  Weight: 101.606 kg (224 lb)    History of present illness:  63 year old female with history of upper tension, hyperlipidemia, anxiety, GERD, arthritis and history of diverticulitis few years back presented with left lower quadrant abdominal pain for past 2 days which has worsened since yesterday and involving left lower quadrant and suprapubic area. Patient reports having cramping abdominal pain which is worse after eating and associated with dry heaving. She reports having a fever of 100 F at home yesterday. Denies any chills, vomiting, chest pain, palpitations, epigastric pain, diarrhea or dysuria. Denies any change in her weight or appetite. Denies frequent abdominal pain, jaundice or abdominal fullness.  Blood work done showed normal CBC and chemistry including LFTs. Lipase was normal. Lactic acid was normal. A CT scan of the abdomen and pelvis with contrast done showed acute sigmoid diverticulitis without any fluid collection or abscess.  Incidentally found  pancreatic body mass with associated dilatation of the distal pancreatic duct in the pancreatic tail worrisome for obstructing neoplasm. Also shows upper abdominal lymphadenopathy which is possibly reactive.  given a dose of IV ciprofloxacin and Flagyl in the ED   Hospital Course:  Admitted to hospitalists.  Started on clear liquids, pain and nausea medicine as needed. cipro and flagyl continued. Reported previous h/o diverticulitis.  Reportedly had colonoscopy a few years ago  With [facility] showing polyps.  MRCP done concerning for pancreatic neoplasm.  CA 19-9 normal.  Pain, nausea vomiting improved. Patient's diet advanced. Changed to po abx.  Tolerating solids and po abx. Discussed the case with Dr. Paulita Fujita who will see patient in the office next week and schedule EUS.  Procedures:  none  Consultations:  none  Discharge Exam: Filed Vitals:   08/22/14 0538  BP: 127/71  Pulse: 73  Temp: 98.6 F (37 C)  Resp: 18    General: tearful,  Cardiovascular: RRR Respiratory: CTA Abd: minimal LLQ TTP  Discharge Instructions   Discharge Instructions    Activity as tolerated - No restrictions    Complete by:  As directed      Discharge instructions    Complete by:  As directed   Low fiber diet for 2 weeks, then high fiber heart healthy          Current Discharge Medication List    START taking these medications   Details  acetaminophen (TYLENOL) 325 MG tablet Take 2 tablets (650 mg total) by mouth every 6 (six) hours as needed for mild pain (or Fever >/= 101).    ciprofloxacin (CIPRO) 500 MG tablet Take 1 tablet (500 mg total) by mouth 2 (two) times daily. Until gone Qty: 14 tablet, Refills: 0    HYDROcodone-acetaminophen (NORCO/VICODIN) 5-325 MG per tablet Take 1-2 tablets by mouth every 4 (four) hours as needed for moderate pain. Qty: 20 tablet, Refills: 0    LORazepam (ATIVAN) 0.5 MG tablet Take 1 tablet (0.5 mg total)  by mouth 2 (two) times daily as needed for anxiety (restlessness). Qty: 20 tablet, Refills: 0    metroNIDAZOLE (FLAGYL) 500 MG tablet Take 1 tablet (500 mg total) by mouth every 8 (eight) hours. Until gone Qty: 21 tablet, Refills: 0    pantoprazole (PROTONIX) 40 MG tablet Take 1 tablet (40 mg total) by mouth daily. Qty: 30 tablet, Refills: 0    promethazine  (PHENERGAN) 12.5 MG tablet Take 1 tablet (12.5 mg total) by mouth every 6 (six) hours as needed for nausea or vomiting. Qty: 20 tablet, Refills: 0      CONTINUE these medications which have NOT CHANGED   Details  atorvastatin (LIPITOR) 10 MG tablet Take 10 mg by mouth daily.    Cholecalciferol (VITAMIN D-3 PO) Take 1 capsule by mouth daily.    Multiple Vitamin (MULTIVITAMIN WITH MINERALS) TABS tablet Take 1 tablet by mouth daily.    omega-3 acid ethyl esters (LOVAZA) 1 G capsule Take 1 g by mouth 2 (two) times daily.    valsartan-hydrochlorothiazide (DIOVAN-HCT) 80-12.5 MG per tablet Take 1 tablet by mouth daily.      STOP taking these medications     ibuprofen (ADVIL,MOTRIN) 200 MG tablet      naproxen (NAPROSYN) 250 MG tablet      NON FORMULARY        No Known Allergies Follow-up Information    Follow up with Nena Jordan, IBTEHAL, MD.   Specialty:  Internal Medicine   Why:  or primary care provider of choice to establish care   Contact information:   [place]  [place] [address] [phone]        The results of significant diagnostics from this hospitalization (including imaging, microbiology, ancillary and laboratory) are listed below for reference.    Significant Diagnostic Studies: Ct Abdomen Pelvis W Contrast  08/20/2014   ADDENDUM REPORT: 08/20/2014 00:05  ADDENDUM: These results were called by telephone at the time of interpretation on 08/19/2014 at 11:55 am to Dr. Ezequiel Essex , who verbally acknowledged these results.   Electronically Signed   By: Julian Hy M.D.   On: 08/20/2014 00:05   08/20/2014   CLINICAL DATA:  Left lower quadrant pain since Friday. Nausea, fever. History of diverticulitis.  EXAM: CT ABDOMEN AND PELVIS WITH CONTRAST  TECHNIQUE: Multidetector CT imaging of the abdomen and pelvis was performed using the standard protocol following bolus administration of intravenous contrast.  CONTRAST:  158mL  OMNIPAQUE IOHEXOL 300 MG/ML  SOLN  COMPARISON:  01/14/2012  FINDINGS: Lower chest:  Lung bases are clear.  Hepatobiliary: 6 mm cyst in the medial segment left hepatic lobe (series 21/ image 17).  Status post cholecystectomy. No intrahepatic or extrahepatic ductal dilatation.  Pancreas: Mild enlargement of the pancreatic body with associated dilatation of the distal pancreatic duct in the pancreatic tail (series 201/image 23), worrisome for obstructing pancreatic mass.  No associated vascular invasion.  Spleen: Within normal limits.  Adrenals/Urinary Tract: Adrenal is unremarkable.  Kidneys within normal limits.  No hydronephrosis.  Bladder is within normal limits  Stomach/Bowel: Stomach is unremarkable.  No evidence of bowel obstruction.  Normal appendix.  Colonic diverticulosis, with pericolonic inflammatory changes along the sigmoid colon (series 21/image 23), compatible with sigmoid diverticulitis.  No drainable fluid collection/abscess.  No free air.  Vascular/Lymphatic: Atherosclerotic calcifications of the abdominal aorta and branch vessels.  Upper abdominal lymphadenopathy, including:  --17 mm short axis portacaval node (series 2/ image 26), previously 12 mm  --9  mm short axis periportal nodes (series 2/ image 23), unchanged  --11 mm short axis gastrohepatic node (series 2/image 20), previously 7 mm  Reproductive: Enlarged, heterogeneous uterus with multiple uterine fibroids.  Bilateral ovaries are grossly unremarkable.  Other: No abdominopelvic ascites.  Musculoskeletal: Degenerative changes of the visualized thoracolumbar spine.  IMPRESSION: Sigmoid diverticulitis. No drainable fluid collection/abscess. No free air.  Mild enlargement of the pancreatic body with associated dilatation of the distal pancreatic duct in the pancreatic tail, worrisome for obstructing pancreatic mass. Consider EUS with tissue sampling. Alternatively, if further imaging is desired, MRI abdomen with/without contrast can be performed  following resolution of the patient's acute symptoms.  No associated vascular invasion.  Upper abdominal lymphadenopathy, possibly reactive.  Electronically Signed: By: Julian Hy M.D. On: 08/19/2014 23:43   Mr Jeananne Rama W/wo Cm/mrcp  08/21/2014   CLINICAL DATA:  Pancreatic mass seen on recent CT. Left lower quadrant pain.  EXAM: MRI ABDOMEN WITHOUT AND WITH CONTRAST (INCLUDING MRCP)  TECHNIQUE: Multiplanar multisequence MR imaging of the abdomen was performed both before and after the administration of intravenous contrast. Heavily T2-weighted images of the biliary and pancreatic ducts were obtained, and three-dimensional MRCP images were rendered by post processing.  CONTRAST:  63mL MULTIHANCE GADOBENATE DIMEGLUMINE 529 MG/ML IV SOLN  COMPARISON:  CT on 08/19/2014  FINDINGS: Lower chest:  Unremarkable.  Liver: Scattered tiny sub-cm hepatic cysts are noted, however no liver masses are identified. Mild hepatic steatosis also demonstrated.  Pancreas: A solid enhancing mass is seen in the pancreatic body which measures 2.5 x 3.2 cm on image 52 of series 1303. This mass appears to involve both the proximal splenic artery and vein on image 53 of series 1301 and 54 of series 1303, but does not involve the celiac trunk or superior mesenteric artery.  MRCP - Biliary and pancreatic ducts: No evidence of biliary ductal dilatation or choledocholithiasis. The main pancreatic duct is dilated in the pancreatic tail, proximal to the pancreatic body mass.  Spleen:  Within normal limits in size and appearance.  Adrenal Glands:  No masses identified.  Kidneys: No complex cystic or solid masses identified. No evidence of urolithiasis or hydronephrosis.  Stomach/Bowel/Peritoneum: No evidence of wall thickening, mass, or obstruction involving visualized abdominal bowel.  Vascular/Lymphatic: Mild peripancreatic lymphadenopathy is seen, with largest lymph node in the portacaval space measuring 15 mm.  Other:  None.  Musculoskeletal:   No suspicious bone lesions identified.  IMPRESSION: 3.2 cm solid mass in the pancreatic body causing upstream pancreatic ductal dilatation, highly suspicious for pancreatic carcinoma. This mass appears to involve the proximal splenic artery and vein. Consider EUS for tissue diagnosis.  Mild peripancreatic lymphadenopathy, suspicious for metastatic disease.  No evidence of liver metastases or biliary ductal dilatation.   Electronically Signed   By: Earle Gell M.D.   On: 08/21/2014 08:47    Microbiology: No results found for this or any previous visit (from the past 240 hour(s)).   Labs: Basic Metabolic Panel:  Recent Labs Lab 08/19/14 1532  NA 140  K 4.1  CL 105  CO2 22  GLUCOSE 141*  BUN 13  CREATININE 0.83  CALCIUM 9.1   Liver Function Tests:  Recent Labs Lab 08/19/14 1532  AST 17  ALT 17  ALKPHOS 74  BILITOT 0.4  PROT 7.1  ALBUMIN 3.2*    Recent Labs Lab 08/19/14 1532  LIPASE 21   No results for input(s): AMMONIA in the last 168 hours. CBC:  Recent Labs Lab 08/19/14 1532  WBC 5.4  NEUTROABS 3.4  HGB 12.0  HCT 36.6  MCV 85.9  PLT 311   Cardiac Enzymes: No results for input(s): CKTOTAL, CKMB, CKMBINDEX, TROPONINI in the last 168 hours. BNP: BNP (last 3 results) No results for input(s): PROBNP in the last 8760 hours. CBG: No results for input(s): GLUCAP in the last 168 hours.     SignedDelfina Redwood  [facility] 08/22/2014, 10:45 AM

## 2014-08-22 NOTE — Progress Notes (Signed)
[name] to be D/C'd Home per MD order.  Discussed with the patient and all questions fully answered.  VSS, Skin clean, dry and intact without evidence of skin break down, no evidence of skin tears noted. IV catheter discontinued intact. Site without signs and symptoms of complications. Dressing and pressure applied.  An After Visit Summary was printed and given to the patient. Patient received prescriptions.  D/c education completed with patient/family including follow up instructions, medication list, d/c activities limitations if indicated, with other d/c instructions as indicated by MD - patient able to verbalize understanding, all questions fully answered.   Patient instructed to return to ED, call 911, or call MD for any changes in condition.   Patient escorted via [place], and D/C home via private auto.  [name] 08/22/2014 11:06 AM

## 2014-08-22 NOTE — Progress Notes (Signed)
UR completed.  Lucina Betty, RN BSN [facility] CCM Trauma/Neuro ICU Case Manager [phone]  

## 2014-08-26 ENCOUNTER — Encounter (HOSPITAL_COMMUNITY): Payer: Self-pay | Admitting: *Deleted

## 2014-08-28 ENCOUNTER — Ambulatory Visit (HOSPITAL_COMMUNITY): Admission: RE | Admit: 2014-08-28 | Payer: Self-pay | Source: Ambulatory Visit | Admitting: Gastroenterology

## 2014-08-28 HISTORY — DX: Dry eye syndrome of bilateral lacrimal glands: H04.123

## 2014-08-28 SURGERY — ESOPHAGEAL ENDOSCOPIC ULTRASOUND (EUS) RADIAL
Anesthesia: Monitor Anesthesia Care

## 2014-09-24 ENCOUNTER — Other Ambulatory Visit: Payer: Self-pay | Admitting: Gastroenterology

## 2014-09-24 ENCOUNTER — Encounter (HOSPITAL_COMMUNITY): Payer: Self-pay | Admitting: *Deleted

## 2014-09-24 NOTE — Addendum Note (Signed)
Addended by: Arta Silence on: 09/24/2014 12:23 PM   Modules accepted: Orders

## 2014-09-24 NOTE — Anesthesia Preprocedure Evaluation (Addendum)
Anesthesia Evaluation  Patient identified by MRN, date of birth, ID band Patient awake    Reviewed: Allergy & Precautions, NPO status , Patient's Chart, lab work & pertinent test results  Airway Mallampati: II  TM Distance: >3 FB Neck ROM: Full    Dental  (+) Edentulous Upper   Pulmonary former smoker,  breath sounds clear to auscultation  Pulmonary exam normal       Cardiovascular Exercise Tolerance: Good hypertension, Pt. on medications Rhythm:Regular Rate:Normal  ECG: 02-11-14: ST 120  ECHO: 2008 EF 70%   Neuro/Psych  Headaches, PSYCHIATRIC DISORDERS Anxiety    GI/Hepatic Neg liver ROS, GERD-  Medicated,  Endo/Other  negative endocrine ROS  Renal/GU negative Renal ROS  negative genitourinary   Musculoskeletal  (+) Arthritis -,   Abdominal   Peds negative pediatric ROS (+)  Hematology negative hematology ROS (+)   Anesthesia Other Findings   Reproductive/Obstetrics negative OB ROS                           Anesthesia Physical Anesthesia Plan  ASA: II  Anesthesia Plan: MAC   Post-op Pain Management:    Induction: Intravenous  Airway Management Planned:   Additional Equipment:   Intra-op Plan:   Post-operative Plan:   Informed Consent: I have reviewed the patients History and Physical, chart, labs and discussed the procedure including the risks, benefits and alternatives for the proposed anesthesia with the patient or authorized representative who has indicated his/her understanding and acceptance.   Dental advisory given  Plan Discussed with: CRNA  Anesthesia Plan Comments:         Anesthesia Quick Evaluation

## 2014-09-25 ENCOUNTER — Ambulatory Visit (HOSPITAL_COMMUNITY): Payer: BLUE CROSS/BLUE SHIELD | Admitting: Anesthesiology

## 2014-09-25 ENCOUNTER — Ambulatory Visit (HOSPITAL_COMMUNITY)
Admission: RE | Admit: 2014-09-25 | Discharge: 2014-09-25 | Disposition: A | Discharge status: Home / Self Care | Payer: BLUE CROSS/BLUE SHIELD | Source: Ambulatory Visit | Attending: Gastroenterology | Admitting: Gastroenterology

## 2014-09-25 ENCOUNTER — Encounter (HOSPITAL_COMMUNITY)
Admission: RE | Disposition: A | Discharge status: Home / Self Care | Payer: Self-pay | Source: Ambulatory Visit | Attending: Gastroenterology

## 2014-09-25 ENCOUNTER — Encounter (HOSPITAL_COMMUNITY): Payer: Self-pay

## 2014-09-25 DIAGNOSIS — K869 Disease of pancreas, unspecified: Secondary | ICD-10-CM | POA: Insufficient documentation

## 2014-09-25 DIAGNOSIS — M1990 Unspecified osteoarthritis, unspecified site: Secondary | ICD-10-CM | POA: Insufficient documentation

## 2014-09-25 DIAGNOSIS — K219 Gastro-esophageal reflux disease without esophagitis: Secondary | ICD-10-CM | POA: Insufficient documentation

## 2014-09-25 DIAGNOSIS — I10 Essential (primary) hypertension: Secondary | ICD-10-CM | POA: Diagnosis not present

## 2014-09-25 DIAGNOSIS — Z87891 Personal history of nicotine dependence: Secondary | ICD-10-CM | POA: Insufficient documentation

## 2014-09-25 HISTORY — PX: FINE NEEDLE ASPIRATION: SHX5430

## 2014-09-25 HISTORY — PX: EUS: SHX5427

## 2014-09-25 SURGERY — ESOPHAGEAL ENDOSCOPIC ULTRASOUND (EUS) RADIAL
Anesthesia: Monitor Anesthesia Care

## 2014-09-25 MED ORDER — OXYCODONE-ACETAMINOPHEN 10-325 MG PO TABS: 1.0000 | ORAL_TABLET | Freq: Four times a day (QID) | ORAL | Status: DC | PRN

## 2014-09-25 MED ORDER — PROPOFOL 10 MG/ML IV BOLUS
INTRAVENOUS | Status: AC
  Filled 2014-09-25: qty 20

## 2014-09-25 MED ORDER — BUTAMBEN-TETRACAINE-BENZOCAINE 2-2-14 % EX AERO
INHALATION_SPRAY | CUTANEOUS | Status: DC | PRN
  Administered 2014-09-25: 2 via TOPICAL

## 2014-09-25 MED ORDER — AMOXICILLIN-POT CLAVULANATE 875-125 MG PO TABS: 1.0000 | ORAL_TABLET | Freq: Two times a day (BID) | ORAL | Status: DC

## 2014-09-25 MED ORDER — LACTATED RINGERS IV SOLN
INTRAVENOUS | Status: DC | PRN
  Administered 2014-09-25: 08:00:00 via INTRAVENOUS

## 2014-09-25 MED ORDER — PROPOFOL INFUSION 10 MG/ML OPTIME
INTRAVENOUS | Status: DC | PRN
  Administered 2014-09-25: 100 ug/kg/min via INTRAVENOUS

## 2014-09-25 MED ORDER — SODIUM CHLORIDE 0.9 % IV SOLN: INTRAVENOUS | Status: DC

## 2014-09-25 NOTE — Transfer of Care (Signed)
Immediate Anesthesia Transfer of Care Note  Patient: [name]  Procedure(s) Performed: Procedure(s) with comments: ESOPHAGEAL ENDOSCOPIC ULTRASOUND (EUS) RADIAL (N/A) FINE NEEDLE ASPIRATION (FNA) LINEAR (N/A) - = or- fna  Patient Location: PACU  Anesthesia Type:MAC  Level of Consciousness: sedated  Airway & Oxygen Therapy: Patient Spontanous Breathing and Patient connected to nasal cannula oxygen  Post-op Assessment: Report given to RN and Post -op Vital signs reviewed and stable  Post vital signs: Reviewed and stable  Last Vitals:  Filed Vitals:   09/25/14 0750  BP: 136/83  Pulse: 78  Temp: 36.7 C  Resp: 16    Complications: No apparent anesthesia complications

## 2014-09-25 NOTE — Discharge Instructions (Signed)
Esophagogastroduodenoscopy Care After Refer to this sheet in the next few weeks. These instructions provide you with information on caring for yourself after your procedure. Your caregiver may also give you more specific instructions. Your treatment has been planned according to current medical practices, but problems sometimes occur. Call your caregiver if you have any problems or questions after your procedure.  HOME CARE INSTRUCTIONS  Do not eat or drink anything until the numbing medicine (local anesthetic) has worn off and your gag reflex has returned. You will know that the local anesthetic has worn off when you can swallow comfortably.  Do not drive for 12 hours after the procedure or as directed by your caregiver.  Only take medicines as directed by your caregiver.  BLAND LOW FAT diet for the next few days SEEK MEDICAL CARE IF:   You cannot stop coughing.  You are not urinating at all or less than usual. SEEK IMMEDIATE MEDICAL CARE IF:  You have difficulty swallowing.  You cannot eat or drink.  You have worsening throat or chest pain.  You have dizziness, lightheadedness, or you faint.  You have nausea or vomiting.  You have chills.  You have a fever.  You have severe abdominal pain.  You have black, tarry, or bloody stools. Document Released: 07/26/2012 Document Reviewed: 07/26/2012 [facility] Patient Information 2015 [employer]. This information is not intended to replace advice given to you by your health care provider. Make sure you discuss any questions you have with your health care provider.

## 2014-09-25 NOTE — H&P (Signed)
Patient interval history reviewed.  Patient examined again.  There has been no change from documented H/P dated 08/27/14 (scanned into chart from our office) except as documented above  Assessment:  1.  Pancreatic mass.  Normal Ca 19-9. 2.  History diverticulitis ~ one month ago.  Plan:  1.  Endoscopic ultrasound with possible fine needle aspiration biopsies. 2.  Risks (bleeding, infection, bowel perforation that could require surgery, sedation-related changes in cardiopulmonary systems), benefits (identification and possible treatment of source of symptoms, exclusion of certain causes of symptoms), and alternatives (watchful waiting, radiographic imaging studies, empiric medical treatment) of upper endoscopy with ultrasound and possible biopsies (EUS +/- FNA) were explained to patient/family in detail and patient wishes to proceed.

## 2014-09-25 NOTE — Anesthesia Postprocedure Evaluation (Signed)
  Anesthesia Post-op Note  Patient: [name]  Procedure(s) Performed: Procedure(s) (LRB): ESOPHAGEAL ENDOSCOPIC ULTRASOUND (EUS) RADIAL (N/A) FINE NEEDLE ASPIRATION (FNA) LINEAR (N/A)  Patient Location: PACU  Anesthesia Type: MAC  Level of Consciousness: awake and alert   Airway and Oxygen Therapy: Patient Spontanous Breathing  Post-op Pain: mild  Post-op Assessment: Post-op Vital signs reviewed, Patient's Cardiovascular Status Stable, Respiratory Function Stable, Patent Airway and No signs of Nausea or vomiting  Last Vitals:  Filed Vitals:   09/25/14 0940  BP: 148/89  Pulse: 71  Temp:   Resp: 19    Post-op Vital Signs: stable   Complications: No apparent anesthesia complications

## 2014-09-25 NOTE — Op Note (Signed)
[facility] [place], [address]   ENDOSCOPIC ULTRASOUND PROCEDURE REPORT  PATIENT: [name], [name]  MR#: [id number] BIRTHDATE: [birth year]  GENDER: female ENDOSCOPIST: Arta Silence, MD REFERRED BY: PROCEDURE DATE:  09/25/2014 PROCEDURE:   Upper EUS w/FNA ASA CLASS:      Class II INDICATIONS:   1.  pancreatic mass. MEDICATIONS: Monitored anesthesia care  DESCRIPTION OF PROCEDURE:   After the risks benefits and alternatives of the procedure were  explained, informed consent was obtained. The patient was then placed in the left, lateral, decubitus postion and IV sedation was administered. Throughout the procedure, the patients blood pressure, pulse and oxygen saturations were monitored continuously.  Under direct visualization, the linear oblique-viewing  echoendoscope was introduced through the mouth  and advanced to the stomach antrum . Water was used as necessary to provide an acoustic interface.  Upon completion of the imaging, water was removed and the patient was sent to the recovery room in satisfactory condition.    FINDINGS:      Round hypoechoic well-defined mass in body of pancreas, measuring about 15 x 18 mm in diameter, with upstream pancreatic ductal dilatation (up to 52mm).  Tail of pancreas was somewhat lobular, likely reflective of protracted ductal obstruction.  Left lobe of liver normal.  No obvious involvement of lesion with SMA and celiac axis.  No peripancreatic adenopathy. Mass was biopsied x 4 with 25g needle (2 passes of which were used entirely for cell block).  Preliminary cytology, reviewed in my presence by Dr. Nicoletta Dress, shows inflammatory cells and scattered uniform-appearing cells of unclear significance.  IMPRESSION:     Pancreatic body mass, biopsied.  RECOMMENDATIONS:     1.  Watch for potential complications of procedure. 2.  Await final cytology results. 3.  If no obvious malignancy on cytology, would consider MRI  in 2-3 months with possible repeat EUS-guided biopsies (19g or possible core biopsies) of mass pending the MRI results.   _______________________________ Lorrin MaisArta Silence, MD 09/25/2014 9:34 AM   CC:

## 2014-09-26 ENCOUNTER — Encounter (HOSPITAL_COMMUNITY): Payer: Self-pay | Admitting: Gastroenterology

## 2014-10-02 ENCOUNTER — Other Ambulatory Visit: Payer: Self-pay | Admitting: Gastroenterology

## 2014-10-02 DIAGNOSIS — K8689 Other specified diseases of pancreas: Secondary | ICD-10-CM

## 2014-10-14 ENCOUNTER — Other Ambulatory Visit: Payer: BLUE CROSS/BLUE SHIELD

## 2014-11-08 ENCOUNTER — Ambulatory Visit
Admission: RE | Admit: 2014-11-08 | Discharge: 2014-11-08 | Disposition: A | Discharge status: Home / Self Care | Payer: BLUE CROSS/BLUE SHIELD | Source: Ambulatory Visit | Attending: Gastroenterology | Admitting: Gastroenterology

## 2014-11-08 DIAGNOSIS — K8689 Other specified diseases of pancreas: Secondary | ICD-10-CM

## 2014-11-08 MED ORDER — GADOBENATE DIMEGLUMINE 529 MG/ML IV SOLN
20.0000 mL | Freq: Once | INTRAVENOUS | Status: AC | PRN
  Administered 2014-11-08: 20 mL via INTRAVENOUS

## 2014-12-03 ENCOUNTER — Other Ambulatory Visit: Payer: Self-pay | Admitting: General Surgery

## 2015-01-02 ENCOUNTER — Ambulatory Visit (INDEPENDENT_AMBULATORY_CARE_PROVIDER_SITE_OTHER): Payer: BLUE CROSS/BLUE SHIELD | Admitting: Endocrinology

## 2015-01-02 ENCOUNTER — Other Ambulatory Visit (HOSPITAL_COMMUNITY)
Admission: RE | Admit: 2015-01-02 | Discharge: 2015-01-02 | Disposition: A | Discharge status: Home / Self Care | Payer: BLUE CROSS/BLUE SHIELD | Source: Ambulatory Visit | Attending: Endocrinology | Admitting: Endocrinology

## 2015-01-02 ENCOUNTER — Encounter: Payer: Self-pay | Admitting: Endocrinology

## 2015-01-02 VITALS — BP 134/80 | HR 84 | Temp 98.1°F | Wt 214.0 lb

## 2015-01-02 DIAGNOSIS — I10 Essential (primary) hypertension: Secondary | ICD-10-CM

## 2015-01-02 DIAGNOSIS — E119 Type 2 diabetes mellitus without complications: Secondary | ICD-10-CM | POA: Insufficient documentation

## 2015-01-02 DIAGNOSIS — E041 Nontoxic single thyroid nodule: Secondary | ICD-10-CM | POA: Insufficient documentation

## 2015-01-02 DIAGNOSIS — E785 Hyperlipidemia, unspecified: Secondary | ICD-10-CM | POA: Diagnosis not present

## 2015-01-02 MED ORDER — METFORMIN HCL ER 500 MG PO TB24: 500.0000 mg | ORAL_TABLET | Freq: Every day | ORAL | Status: DC

## 2015-01-02 NOTE — Progress Notes (Signed)
Subjective:    Patient ID: [name], female    DOB: Oct 10, 1950, 64 y.o.   MRN: [id number]  HPI pt states DM was dx'ed in early 12-11-2014; she has mild if any neuropathy of the lower extremities; she is unaware of any associated chronic complications; she has never been on insulin; pt says her diet and exercise are good; she has never had GDM, severe hypoglycemia or DKA.  She was incidentally noted in late 12/10/13, to have a pancreatic mass.  She was recently started on metformin.   Past Medical History  Diagnosis Date  . Hypertension   . High cholesterol     diet controlled - no meds  . SVD (spontaneous vaginal delivery)     x 2  . GERD (gastroesophageal reflux disease)     occas.diet contolled - no meds  . Fibroids   . Headache     "weekly; related to BP" (08/20/2014)  . Arthritis     "knees" (08/20/2014)  . Situational anxiety 2012-12-10    "son passed away"  . Dry eyes     recent treatment    Past Surgical History  Procedure Laterality Date  . Colonoscopy      x3 polyps removed  . Cholecystectomy  1970  . Eus N/A 09/25/2014    Procedure: ESOPHAGEAL ENDOSCOPIC ULTRASOUND (EUS) RADIAL;  Surgeon: Arta Silence, MD;  Location: [facility];  Service: Endoscopy;  Laterality: N/A;  . Fine needle aspiration N/A 09/25/2014    Procedure: FINE NEEDLE ASPIRATION (FNA) LINEAR;  Surgeon: Arta Silence, MD;  Location: [facility];  Service: Endoscopy;  Laterality: N/A;  = or- fna    History   Social History  . Marital Status: Single    Spouse Name: N/A  . Number of Children: N/A  . Years of Education: N/A   Occupational History  . Not on file.   Social History Main Topics  . Smoking status: Former Smoker -- 0.50 packs/day for 10 years    Types: Cigarettes    Quit date: 04/01/1985  . Smokeless tobacco: Never Used  . Alcohol Use: No  . Drug Use: No  . Sexual Activity: Yes    Birth Control/ Protection: Post-menopausal   Other Topics Concern  . Not on file   Social History  Narrative    Current Outpatient Prescriptions on File Prior to Visit  Medication Sig Dispense Refill  . ALPRAZolam (XANAX) 0.25 MG tablet Take 0.125 mg by mouth daily as needed for anxiety.    Marland Kitchen HYDROcodone-acetaminophen (NORCO/VICODIN) 5-325 MG per tablet Take 1-2 tablets by mouth every 4 (four) hours as needed for moderate pain. 20 tablet 0  . LORazepam (ATIVAN) 0.5 MG tablet Take 1 tablet (0.5 mg total) by mouth 2 (two) times daily as needed for anxiety (restlessness). 20 tablet 0  . Multiple Vitamin (MULTIVITAMIN WITH MINERALS) TABS tablet Take 1 tablet by mouth daily.    Marland Kitchen omega-3 acid ethyl esters (LOVAZA) 1 G capsule Take 1 g by mouth daily.     Marland Kitchen OVER THE COUNTER MEDICATION Take 1 tablet by mouth daily.    Marland Kitchen OVER THE COUNTER MEDICATION Take 2 capsules by mouth daily. Arthro-7    . PRESCRIPTION MEDICATION Apply 1 application topically daily as needed (For rash.). Clindamycin Cream    . valsartan-hydrochlorothiazide (DIOVAN-HCT) 80-12.5 MG per tablet Take 1 tablet by mouth every morning.     Marland Kitchen acetaminophen (TYLENOL) 325 MG tablet Take 2 tablets (650 mg total) by mouth every 6 (six) hours as  needed for mild pain (or Fever >/= 101). (Patient not taking: Reported on 01/02/2015)    . amoxicillin-clavulanate (AUGMENTIN) 875-125 MG per tablet Take 1 tablet by mouth 2 (two) times daily. (Patient not taking: Reported on 01/02/2015) 10 tablet 0  . Cholecalciferol (VITAMIN D-3 PO) Take 1 capsule by mouth daily.    Marland Kitchen oxyCODONE-acetaminophen (PERCOCET) 10-325 MG per tablet Take 1 tablet by mouth every 6 (six) hours as needed for pain. (Patient not taking: Reported on 01/02/2015) 30 tablet 0  . pantoprazole (PROTONIX) 40 MG tablet Take 1 tablet (40 mg total) by mouth daily. (Patient not taking: Reported on 01/02/2015) 30 tablet 0  . promethazine (PHENERGAN) 12.5 MG tablet Take 1 tablet (12.5 mg total) by mouth every 6 (six) hours as needed for nausea or vomiting. (Patient not taking: Reported on 09/25/2014)  20 tablet 0   No current facility-administered medications on file prior to visit.    No Known Allergies  Family History  Problem Relation Age of Onset  . Diabetes Maternal Grandmother     BP 134/80 mmHg  Pulse 84  Temp(Src) 98.1 F (36.7 C) (Oral)  Wt 214 lb (97.07 kg)  SpO2 93%  Review of Systems denies weight loss, blurry vision, headache, chest pain, sob, n/v, urinary frequency, muscle cramps, excessive diaphoresis, depression, cold intolerance, rhinorrhea, and easy bruising.  she has fatigue.      Objective:   Physical Exam VS: see vs page GEN: no distress HEAD: head: no deformity eyes: no periorbital swelling, no proptosis external nose and ears are normal mouth: no lesion seen NECK: supple, thyroid is not enlarged CHEST WALL: no deformity LUNGS:  Clear to auscultation.  CV: reg rate and rhythm, no murmur ABD: abdomen is soft, nontender.  no hepatosplenomegaly.  not distended.  no hernia MUSCULOSKELETAL: muscle bulk and strength are grossly normal.  no obvious joint swelling.  gait is normal and steady EXTEMITIES: no deformity.  no ulcer on the feet.  feet are of normal color and temp.  no edema PULSES: dorsalis pedis intact bilat.  no carotid bruit NEURO:  cn 2-12 grossly intact.   readily moves all 4's.  sensation is intact to touch on the feet SKIN:  Normal texture and temperature.  No rash or suspicious lesion is visible.   NODES:  None palpable at the neck PSYCH: alert, well-oriented.  Does not appear anxious nor depressed.  outside test results are reviewed: A1c=7.1  thyroid needle bx:  consent obtained, signed form on chart The area is first sprayed with cooling agent local: xylocaine 2%, with epinephrine prep: alcohol pad 4 bxs are done with 30Z needles no complications     Assessment & Plan:  Thyroid nodule, new, uncertain etiology DM: on metformin. pancreatic mass, new, uncertain etiology: uncertain if related to DM.  She should have resection  despite neg cytology and lack of enlargement on MRI.  Patient is advised the following: Patient Instructions  i agree with the plan to remove the lump from the pancreas. This may leave you needing insulin.  Therefore, you should learn how to inject. good diet and exercise significantly improve the control of your diabetes.  please let me know if you wish to be referred to a dietician.  high blood sugar is very risky to your health.  you should see an eye doctor and dentist every year.  It is very important to get all recommended vaccinations.  controlling your blood pressure and cholesterol drastically reduces the damage diabetes does to your  body.  Those who smoke should quit.  please discuss these with your doctor.  At our office, we are fortunate to have two specialists who are happy to help you:   Leonia Reader, RN, CDE, is a diabetes educator and pump trainer.  She is here on Monday mornings, and all day Tuesday and Wednesday.  She is can help you with low blood sugar avoidance and treatment, injecting insulin, sick day management, and others.   Antonieta Iba, RD is our dietician.  She is here all day Thursday and Friday.  She can advise you about a healthy diet.  She can also help you about a variety of special diabetes situations, such as shift work, Actor, gluten-free, diet for kidney patients, traveling with diabetes, and help for those who need to gain weight.   check your blood sugar once a day.  vary the time of day when you check, between before the 3 meals, and at bedtime.  also check if you have symptoms of your blood sugar being too high or too low.  please keep a record of the readings and bring it to your next appointment here.  You can write it on any piece of paper.  please call us sooner if your blood sugar goes below 70, or if you have a lot of readings over 200.   i have sent a prescription to your pharmacy, for metformin. i'll let you know about the biopsy results Please  come back for a follow-up appointment after your surgery

## 2015-01-02 NOTE — Patient Instructions (Addendum)
i agree with the plan to remove the lump from the pancreas. This may leave you needing insulin.  Therefore, you should learn how to inject. good diet and exercise significantly improve the control of your diabetes.  please let me know if you wish to be referred to a dietician.  high blood sugar is very risky to your health.  you should see an eye doctor and dentist every year.  It is very important to get all recommended vaccinations.  controlling your blood pressure and cholesterol drastically reduces the damage diabetes does to your body.  Those who smoke should quit.  please discuss these with your doctor.  At our office, we are fortunate to have two specialists who are happy to help you:   Leonia Reader, RN, CDE, is a diabetes educator and pump trainer.  She is here on Monday mornings, and all day Tuesday and Wednesday.  She is can help you with low blood sugar avoidance and treatment, injecting insulin, sick day management, and others.   Antonieta Iba, RD is our dietician.  She is here all day Thursday and Friday.  She can advise you about a healthy diet.  She can also help you about a variety of special diabetes situations, such as shift work, Actor, gluten-free, diet for kidney patients, traveling with diabetes, and help for those who need to gain weight.   check your blood sugar once a day.  vary the time of day when you check, between before the 3 meals, and at bedtime.  also check if you have symptoms of your blood sugar being too high or too low.  please keep a record of the readings and bring it to your next appointment here.  You can write it on any piece of paper.  please call us sooner if your blood sugar goes below 70, or if you have a lot of readings over 200.   i have sent a prescription to your pharmacy, for metformin. i'll let you know about the biopsy results Please come back for a follow-up appointment after your surgery

## 2015-01-06 NOTE — Progress Notes (Signed)
   Subjective:    Patient ID: [name], female    DOB: October 21, 1950, 64 y.o.   MRN: [id number]  HPI    Review of Systems     Objective:   Physical Exam  Correction: Neck: 2-3 cm right thyroid nodule      Assessment & Plan:

## 2015-01-08 ENCOUNTER — Telehealth: Payer: Self-pay | Admitting: Endocrinology

## 2015-01-08 NOTE — Telephone Encounter (Signed)
Patient is calling for the results of her labs °

## 2015-01-08 NOTE — Telephone Encounter (Signed)
I called and advised patient that her thyroid biopsy result was negative for cancer. Patient advised of note below and voiced understanding.

## 2015-01-09 ENCOUNTER — Emergency Department (HOSPITAL_COMMUNITY)
Admission: EM | Admit: 2015-01-09 | Discharge: 2015-01-10 | Disposition: A | Discharge status: Home / Self Care | Payer: BLUE CROSS/BLUE SHIELD | Attending: Emergency Medicine | Admitting: Emergency Medicine

## 2015-01-09 ENCOUNTER — Encounter (HOSPITAL_COMMUNITY): Payer: Self-pay | Admitting: Emergency Medicine

## 2015-01-09 DIAGNOSIS — I10 Essential (primary) hypertension: Secondary | ICD-10-CM | POA: Diagnosis not present

## 2015-01-09 DIAGNOSIS — Z87891 Personal history of nicotine dependence: Secondary | ICD-10-CM | POA: Diagnosis not present

## 2015-01-09 DIAGNOSIS — Z8669 Personal history of other diseases of the nervous system and sense organs: Secondary | ICD-10-CM | POA: Insufficient documentation

## 2015-01-09 DIAGNOSIS — Z79899 Other long term (current) drug therapy: Secondary | ICD-10-CM | POA: Insufficient documentation

## 2015-01-09 DIAGNOSIS — K219 Gastro-esophageal reflux disease without esophagitis: Secondary | ICD-10-CM | POA: Insufficient documentation

## 2015-01-09 DIAGNOSIS — M1990 Unspecified osteoarthritis, unspecified site: Secondary | ICD-10-CM | POA: Diagnosis not present

## 2015-01-09 DIAGNOSIS — E780 Pure hypercholesterolemia: Secondary | ICD-10-CM | POA: Diagnosis not present

## 2015-01-09 DIAGNOSIS — F418 Other specified anxiety disorders: Secondary | ICD-10-CM | POA: Insufficient documentation

## 2015-01-09 DIAGNOSIS — K5732 Diverticulitis of large intestine without perforation or abscess without bleeding: Secondary | ICD-10-CM | POA: Diagnosis not present

## 2015-01-09 DIAGNOSIS — Z862 Personal history of diseases of the blood and blood-forming organs and certain disorders involving the immune mechanism: Secondary | ICD-10-CM | POA: Diagnosis not present

## 2015-01-09 DIAGNOSIS — R509 Fever, unspecified: Secondary | ICD-10-CM | POA: Diagnosis present

## 2015-01-09 DIAGNOSIS — K5792 Diverticulitis of intestine, part unspecified, without perforation or abscess without bleeding: Secondary | ICD-10-CM

## 2015-01-09 HISTORY — DX: Diverticulitis of intestine, part unspecified, without perforation or abscess without bleeding: K57.92

## 2015-01-09 LAB — CBC WITH DIFFERENTIAL/PLATELET
BASOS ABS: 0 10*3/uL (ref 0.0–0.1)
Basophils Relative: 0 % (ref 0–1)
EOS ABS: 0 10*3/uL (ref 0.0–0.7)
EOS PCT: 0 % (ref 0–5)
HCT: 38 % (ref 36.0–46.0)
Hemoglobin: 12.9 g/dL (ref 12.0–15.0)
LYMPHS PCT: 16 % (ref 12–46)
Lymphs Abs: 1.2 10*3/uL (ref 0.7–4.0)
MCH: 29.1 pg (ref 26.0–34.0)
MCHC: 33.9 g/dL (ref 30.0–36.0)
MCV: 85.8 fL (ref 78.0–100.0)
Monocytes Absolute: 0.5 10*3/uL (ref 0.1–1.0)
Monocytes Relative: 7 % (ref 3–12)
Neutro Abs: 6 10*3/uL (ref 1.7–7.7)
Neutrophils Relative %: 77 % (ref 43–77)
PLATELETS: 329 10*3/uL (ref 150–400)
RBC: 4.43 MIL/uL (ref 3.87–5.11)
RDW: 14.4 % (ref 11.5–15.5)
WBC: 7.8 10*3/uL (ref 4.0–10.5)

## 2015-01-09 LAB — COMPREHENSIVE METABOLIC PANEL
ALT: 17 U/L (ref 14–54)
AST: 16 U/L (ref 15–41)
Albumin: 3.3 g/dL — ABNORMAL LOW (ref 3.5–5.0)
Alkaline Phosphatase: 66 U/L (ref 38–126)
Anion gap: 9 (ref 5–15)
BUN: 8 mg/dL (ref 6–20)
CO2: 25 mmol/L (ref 22–32)
Calcium: 8.8 mg/dL — ABNORMAL LOW (ref 8.9–10.3)
Chloride: 102 mmol/L (ref 101–111)
Creatinine, Ser: 0.73 mg/dL (ref 0.44–1.00)
GFR calc Af Amer: >60 mL/min (ref >60)
GFR calc non Af Amer: >60 mL/min (ref >60)
Glucose, Bld: 142 mg/dL — ABNORMAL HIGH (ref 65–99)
POTASSIUM: 3.6 mmol/L (ref 3.5–5.1)
Sodium: 136 mmol/L (ref 135–145)
TOTAL PROTEIN: 7.3 g/dL (ref 6.5–8.1)
Total Bilirubin: 0.7 mg/dL (ref 0.3–1.2)

## 2015-01-09 LAB — URINALYSIS, ROUTINE W REFLEX MICROSCOPIC
Bilirubin Urine: NEGATIVE
Glucose, UA: NEGATIVE mg/dL
KETONES UR: 15 mg/dL — AB
Leukocytes, UA: NEGATIVE
NITRITE: NEGATIVE
PROTEIN: 30 mg/dL — AB
Specific Gravity, Urine: 1.023 (ref 1.005–1.030)
UROBILINOGEN UA: 1 mg/dL (ref 0.0–1.0)
pH: 6.5 (ref 5.0–8.0)

## 2015-01-09 LAB — URINE MICROSCOPIC-ADD ON

## 2015-01-09 MED ORDER — METRONIDAZOLE 500 MG PO TABS
500.0000 mg | ORAL_TABLET | Freq: Once | ORAL | Status: AC
  Administered 2015-01-09: 500 mg via ORAL
  Filled 2015-01-09: qty 1

## 2015-01-09 MED ORDER — CIPROFLOXACIN HCL 500 MG PO TABS
500.0000 mg | ORAL_TABLET | Freq: Once | ORAL | Status: AC
  Administered 2015-01-09: 500 mg via ORAL
  Filled 2015-01-09: qty 1

## 2015-01-09 MED ORDER — CIPROFLOXACIN HCL 500 MG PO TABS: 500.0000 mg | ORAL_TABLET | Freq: Two times a day (BID) | ORAL | Status: DC

## 2015-01-09 MED ORDER — HYDROCODONE-ACETAMINOPHEN 5-325 MG PO TABS: 1.0000 | ORAL_TABLET | ORAL | Status: DC | PRN

## 2015-01-09 MED ORDER — METRONIDAZOLE 500 MG PO TABS: 500.0000 mg | ORAL_TABLET | Freq: Two times a day (BID) | ORAL | Status: DC

## 2015-01-09 MED ORDER — HYDROCODONE-ACETAMINOPHEN 5-325 MG PO TABS
1.0000 | ORAL_TABLET | Freq: Once | ORAL | Status: AC
  Administered 2015-01-09: 1 via ORAL
  Filled 2015-01-09: qty 1

## 2015-01-09 NOTE — Discharge Instructions (Signed)

## 2015-01-09 NOTE — ED Provider Notes (Signed)
CSN: [id number]     Arrival date & time 01/09/15  1848 History   First MD Initiated Contact with Patient 01/09/15 2210     Chief Complaint  Patient presents with  . Fever  . Hypertension     (Consider location/radiation/quality/duration/timing/severity/associated sxs/prior Treatment) HPI Yesterday noted fever to 100 and abdominal pain. Tried warm compress with Castor oil. Today fever went to 102 at home and patient continued to have cramping/aching quality left lower abdominal pain. She had 5 episodes of diarrhea yesterday. Today some mixed loose stool with formed stool and one episode of vomiting. The patient states that this time her pain seems improved. She was concerned for possible diverticulitis as she has had that in the past. She states however, when she had a diverticulitis the pain was actually much worse. The patient denies any abnormal vaginal bleeding or discharge. She denies any pain burning or urgency with urination. She does not think she has had a sick contact. She does not work in a health care environment. The patient has a known mass in her pancreas. This has been biopsied and the biopsy was negative. She states she has been afraid to go through with surgical resection as recommended by gastroenterology. She is however scheduled to undergo surgery for that in June. She denies that her pain is in her epigastrium or upper abdomen. She reports that is always been painless. Past Medical History  Diagnosis Date  . Hypertension   . High cholesterol     diet controlled - no meds  . SVD (spontaneous vaginal delivery)     x 2  . GERD (gastroesophageal reflux disease)     occas.diet contolled - no meds  . Fibroids   . Headache     "weekly; related to BP" (08/20/2014)  . Arthritis     "knees" (08/20/2014)  . Situational anxiety 11-24-2012    "son passed away"  . Dry eyes     recent treatment  . Diverticulitis    Past Surgical History  Procedure Laterality Date  . Colonoscopy       x3 polyps removed  . Cholecystectomy  1970  . Eus N/A 09/25/2014    Procedure: ESOPHAGEAL ENDOSCOPIC ULTRASOUND (EUS) RADIAL;  Surgeon: Arta Silence, MD;  Location: [facility];  Service: Endoscopy;  Laterality: N/A;  . Fine needle aspiration N/A 09/25/2014    Procedure: FINE NEEDLE ASPIRATION (FNA) LINEAR;  Surgeon: Arta Silence, MD;  Location: [facility];  Service: Endoscopy;  Laterality: N/A;  = or- fna   Family History  Problem Relation Age of Onset  . Diabetes Maternal Grandmother    History  Substance Use Topics  . Smoking status: Former Smoker -- 0.50 packs/day for 10 years    Types: Cigarettes    Quit date: 04/01/1985  . Smokeless tobacco: Never Used  . Alcohol Use: No   OB History    No data available     Review of Systems 10 Systems reviewed and are negative for acute change except as noted in the HPI.    Allergies  Review of patient's allergies indicates no known allergies.  Home Medications   Prior to Admission medications   Medication Sig Start Date End Date Taking? Authorizing Provider  acetaminophen (TYLENOL) 325 MG tablet Take 2 tablets (650 mg total) by mouth every 6 (six) hours as needed for mild pain (or Fever >/= 101). Patient not taking: Reported on 01/02/2015 08/22/14   Delfina Redwood, MD  ALPRAZolam Duanne Moron) 0.25 MG tablet Take 0.125  mg by mouth daily as needed for anxiety.    Historical Provider, MD  amoxicillin-clavulanate (AUGMENTIN) 875-125 MG per tablet Take 1 tablet by mouth 2 (two) times daily. Patient not taking: Reported on 01/02/2015 09/29/14   Arta Silence, MD  Cholecalciferol (VITAMIN D-3 PO) Take 1 capsule by mouth daily.    Historical Provider, MD  ciprofloxacin (CIPRO) 500 MG tablet Take 1 tablet (500 mg total) by mouth 2 (two) times daily. 01/09/15   Charlesetta Shanks, MD  HYDROcodone-acetaminophen (NORCO/VICODIN) 5-325 MG per tablet Take 1-2 tablets by mouth every 4 (four) hours as needed for moderate pain. 08/22/14   Delfina Redwood, MD  HYDROcodone-acetaminophen (NORCO/VICODIN) 5-325 MG per tablet Take 1-2 tablets by mouth every 4 (four) hours as needed for moderate pain or severe pain. 01/09/15   Charlesetta Shanks, MD  LORazepam (ATIVAN) 0.5 MG tablet Take 1 tablet (0.5 mg total) by mouth 2 (two) times daily as needed for anxiety (restlessness). 08/22/14   Delfina Redwood, MD  metFORMIN (GLUCOPHAGE-XR) 500 MG 24 hr tablet Take 1 tablet (500 mg total) by mouth daily with breakfast. 01/02/15   Renato Shin, MD  metroNIDAZOLE (FLAGYL) 500 MG tablet Take 1 tablet (500 mg total) by mouth 2 (two) times daily. One po bid x 7 days 01/09/15   Charlesetta Shanks, MD  Multiple Vitamin (MULTIVITAMIN WITH MINERALS) TABS tablet Take 1 tablet by mouth daily.    Historical Provider, MD  omega-3 acid ethyl esters (LOVAZA) 1 G capsule Take 1 g by mouth daily.     Historical Provider, MD  OVER THE COUNTER MEDICATION Take 1 tablet by mouth daily.    Historical Provider, MD  OVER THE COUNTER MEDICATION Take 2 capsules by mouth daily. Arthro-7    Historical Provider, MD  oxyCODONE-acetaminophen (PERCOCET) 10-325 MG per tablet Take 1 tablet by mouth every 6 (six) hours as needed for pain. Patient not taking: Reported on 01/02/2015 09/25/14   Arta Silence, MD  pantoprazole (PROTONIX) 40 MG tablet Take 1 tablet (40 mg total) by mouth daily. Patient not taking: Reported on 01/02/2015 08/22/14   Delfina Redwood, MD  PRESCRIPTION MEDICATION Apply 1 application topically daily as needed (For rash.). Clindamycin Cream    Historical Provider, MD  promethazine (PHENERGAN) 12.5 MG tablet Take 1 tablet (12.5 mg total) by mouth every 6 (six) hours as needed for nausea or vomiting. Patient not taking: Reported on 09/25/2014 08/22/14   Delfina Redwood, MD  valsartan-hydrochlorothiazide (DIOVAN-HCT) 80-12.5 MG per tablet Take 1 tablet by mouth every morning.     Historical Provider, MD   BP 156/80 mmHg  Pulse 80  Temp(Src) 98.1 F (36.7 C) (Oral)  Resp  23  Ht 5\' 5"  (1.651 m)  Wt 211 lb (95.709 kg)  BMI 35.11 kg/m2  SpO2 98% Physical Exam  Constitutional: She is oriented to person, place, and time. She appears well-developed and well-nourished.  HENT:  Head: Normocephalic and atraumatic.  Eyes: EOM are normal. Pupils are equal, round, and reactive to light.  Neck: Neck supple.  Cardiovascular: Normal rate, regular rhythm, normal heart sounds and intact distal pulses.   Pulmonary/Chest: Effort normal and breath sounds normal.  Abdominal: Soft. Bowel sounds are normal. She exhibits no distension. There is tenderness.  Mild left lower quadrant tenderness without guarding or rebound. Remainder of abdomen is soft and nontender.  Musculoskeletal: Normal range of motion. She exhibits no edema.  Neurological: She is alert and oriented to person, place, and time. She has normal strength.  Coordination normal. GCS eye subscore is 4. GCS verbal subscore is 5. GCS motor subscore is 6.  Skin: Skin is warm, dry and intact.  Psychiatric: She has a normal mood and affect.    ED Course  Procedures (including critical care time) Labs Review Labs Reviewed  COMPREHENSIVE METABOLIC PANEL - Abnormal; Notable for the following:    Glucose, Bld 142 (*)    Calcium 8.8 (*)    Albumin 3.3 (*)    All other components within normal limits  URINALYSIS, ROUTINE W REFLEX MICROSCOPIC - Abnormal; Notable for the following:    Color, Urine AMBER (*)    APPearance CLOUDY (*)    Hgb urine dipstick MODERATE (*)    Ketones, ur 15 (*)    Protein, ur 30 (*)    All other components within normal limits  CBC WITH DIFFERENTIAL/PLATELET  URINE MICROSCOPIC-ADD ON    Imaging Review No results found.   EKG Interpretation None      MDM   Final diagnoses:  Diverticulitis of intestine without perforation or abscess without bleeding   At this time the patient will be treated empirically for diverticulitis. She does have fever documented in the emergency department  to 100.8. The pain has been predominantly localized to the left lower and lateral quadrant. The patient reports that the pain is not nearly as severe as it had been when she had diverticulitis in the past. Her abdominal examination is nonsurgical without guarding. At this time I will not repeat CT scan. The patient is aware of the need to return if her symptoms should worsen or new symptoms develop.    Charlesetta Shanks, MD 01/10/15 0010

## 2015-01-09 NOTE — ED Notes (Signed)
Pt. reports fever yesterday and elevated blood pressure this evening 196/111 with emesis ( x1) , denies diarrhea.

## 2015-01-14 ENCOUNTER — Ambulatory Visit: Payer: BLUE CROSS/BLUE SHIELD | Admitting: Nutrition

## 2015-01-26 ENCOUNTER — Emergency Department (HOSPITAL_COMMUNITY): Payer: BLUE CROSS/BLUE SHIELD

## 2015-01-26 ENCOUNTER — Observation Stay (HOSPITAL_COMMUNITY)
Admission: EM | Admit: 2015-01-26 | Discharge: 2015-01-27 | Disposition: A | Discharge status: Home / Self Care | Payer: BLUE CROSS/BLUE SHIELD | Attending: Internal Medicine | Admitting: Internal Medicine

## 2015-01-26 ENCOUNTER — Encounter (HOSPITAL_COMMUNITY): Payer: Self-pay | Admitting: *Deleted

## 2015-01-26 DIAGNOSIS — Z7982 Long term (current) use of aspirin: Secondary | ICD-10-CM | POA: Diagnosis not present

## 2015-01-26 DIAGNOSIS — F419 Anxiety disorder, unspecified: Secondary | ICD-10-CM | POA: Insufficient documentation

## 2015-01-26 DIAGNOSIS — E119 Type 2 diabetes mellitus without complications: Secondary | ICD-10-CM | POA: Diagnosis not present

## 2015-01-26 DIAGNOSIS — E780 Pure hypercholesterolemia: Secondary | ICD-10-CM | POA: Diagnosis not present

## 2015-01-26 DIAGNOSIS — Z87891 Personal history of nicotine dependence: Secondary | ICD-10-CM | POA: Insufficient documentation

## 2015-01-26 DIAGNOSIS — I10 Essential (primary) hypertension: Secondary | ICD-10-CM | POA: Diagnosis not present

## 2015-01-26 DIAGNOSIS — K219 Gastro-esophageal reflux disease without esophagitis: Secondary | ICD-10-CM | POA: Insufficient documentation

## 2015-01-26 DIAGNOSIS — I160 Hypertensive urgency: Secondary | ICD-10-CM | POA: Diagnosis present

## 2015-01-26 DIAGNOSIS — R079 Chest pain, unspecified: Secondary | ICD-10-CM

## 2015-01-26 LAB — I-STAT TROPONIN, ED: Troponin i, poc: 0 ng/mL (ref 0.00–0.08)

## 2015-01-26 LAB — CBC WITH DIFFERENTIAL/PLATELET
BASOS ABS: 0 10*3/uL (ref 0.0–0.1)
BASOS PCT: 1 % (ref 0–1)
Eosinophils Absolute: 0.2 10*3/uL (ref 0.0–0.7)
Eosinophils Relative: 4 % (ref 0–5)
HEMATOCRIT: 38.3 % (ref 36.0–46.0)
Hemoglobin: 13.1 g/dL (ref 12.0–15.0)
LYMPHS ABS: 2.3 10*3/uL (ref 0.7–4.0)
Lymphocytes Relative: 43 % (ref 12–46)
MCH: 29.5 pg (ref 26.0–34.0)
MCHC: 34.2 g/dL (ref 30.0–36.0)
MCV: 86.3 fL (ref 78.0–100.0)
Monocytes Absolute: 0.4 10*3/uL (ref 0.1–1.0)
Monocytes Relative: 6 % (ref 3–12)
NEUTROS ABS: 2.5 10*3/uL (ref 1.7–7.7)
NEUTROS PCT: 46 % (ref 43–77)
PLATELETS: 408 10*3/uL — AB (ref 150–400)
RBC: 4.44 MIL/uL (ref 3.87–5.11)
RDW: 14.8 % (ref 11.5–15.5)
WBC: 5.4 10*3/uL (ref 4.0–10.5)

## 2015-01-26 LAB — BASIC METABOLIC PANEL
ANION GAP: 9 (ref 5–15)
BUN: 12 mg/dL (ref 6–20)
CO2: 26 mmol/L (ref 22–32)
Calcium: 8.9 mg/dL (ref 8.9–10.3)
Chloride: 105 mmol/L (ref 101–111)
Creatinine, Ser: 0.82 mg/dL (ref 0.44–1.00)
GFR calc Af Amer: >60 mL/min (ref >60)
GFR calc non Af Amer: >60 mL/min (ref >60)
GLUCOSE: 140 mg/dL — AB (ref 65–99)
POTASSIUM: 3.8 mmol/L (ref 3.5–5.1)
Sodium: 140 mmol/L (ref 135–145)

## 2015-01-26 NOTE — ED Notes (Signed)
Patient presents with c/o centralized chest pain that has not gone away   +lightheadness

## 2015-01-26 NOTE — ED Provider Notes (Signed)
CSN: [id number]     Arrival date & time 01/26/15  11/22/2155 History  This chart was scribed for Samantha Flemings, MD by Delphia Grates, ED Scribe. This patient was seen in room B16C/B16C and the patient's care was started at 11:56 PM.   Chief Complaint  Patient presents with  . Chest Pain  . Hypertension    The history is provided by the patient. No language interpreter was used.     HPI Comments: [name] is a 64 y.o. female, with history of HTN, who presents to the Emergency Department complaining of constant, 9/10, mid to upper chest pain that began approximately 4 hours ago. Patient describes her pain as pressure or "gas" with associated light-headedness. She states the pain is exacerbated with lying down. Patient also reports increase in BP after onset of chest pain. Patient states she checked her blood pressure at home 3 times PTA and states it was initially 183/110, 196/111, and 212/117. BP is currently 182/92. Patient states she is currently on Diovan and amlodipine, however, she reports she recently ran out of Diovan yesterday and has yet to get a refill. Patient reports her BP was being managed by Dr. Monia Pouch in [place], [place], however she is now seeing Dr. Antony Blackbird and was placed on amlodipine due to persistent high BP with Diovan. Patient states she still has chest pain at this time, but has subsided since onset. She denies fever, chills, nausea, vomiting, or diaphoresis.  Past Medical History  Diagnosis Date  . Hypertension   . High cholesterol     diet controlled - no meds  . SVD (spontaneous vaginal delivery)     x 2  . GERD (gastroesophageal reflux disease)     occas.diet contolled - no meds  . Fibroids   . Headache     "weekly; related to BP" (08/20/2014)  . Arthritis     "knees" (08/20/2014)  . Situational anxiety Nov 21, 2012    "son passed away"  . Dry eyes     recent treatment  . Diverticulitis    Past Surgical History  Procedure Laterality Date  . Colonoscopy      x3  polyps removed  . Cholecystectomy  1970  . Eus N/A 09/25/2014    Procedure: ESOPHAGEAL ENDOSCOPIC ULTRASOUND (EUS) RADIAL;  Surgeon: Arta Silence, MD;  Location: [facility];  Service: Endoscopy;  Laterality: N/A;  . Fine needle aspiration N/A 09/25/2014    Procedure: FINE NEEDLE ASPIRATION (FNA) LINEAR;  Surgeon: Arta Silence, MD;  Location: [facility];  Service: Endoscopy;  Laterality: N/A;  = or- fna   Family History  Problem Relation Age of Onset  . Diabetes Maternal Grandmother    History  Substance Use Topics  . Smoking status: Former Smoker -- 0.50 packs/day for 10 years    Types: Cigarettes    Quit date: 04/01/1985  . Smokeless tobacco: Never Used  . Alcohol Use: No   OB History    No data available     Review of Systems  Constitutional: Negative for diaphoresis.  Cardiovascular: Positive for chest pain.  Gastrointestinal: Negative for nausea and vomiting.  Neurological: Positive for light-headedness.      Allergies  Review of patient's allergies indicates no known allergies.  Home Medications   Prior to Admission medications   Medication Sig Start Date End Date Taking? Authorizing Provider  acetaminophen (TYLENOL) 325 MG tablet Take 2 tablets (650 mg total) by mouth every 6 (six) hours as needed for mild pain (or Fever >/= 101).  Patient not taking: Reported on 01/02/2015 08/22/14   Delfina Redwood, MD  ALPRAZolam Duanne Moron) 0.25 MG tablet Take 0.125 mg by mouth daily as needed for anxiety.    Historical Provider, MD  amoxicillin-clavulanate (AUGMENTIN) 875-125 MG per tablet Take 1 tablet by mouth 2 (two) times daily. Patient not taking: Reported on 01/02/2015 09/29/14   Arta Silence, MD  Cholecalciferol (VITAMIN D-3 PO) Take 1 capsule by mouth daily.    Historical Provider, MD  ciprofloxacin (CIPRO) 500 MG tablet Take 1 tablet (500 mg total) by mouth 2 (two) times daily. 01/09/15   Charlesetta Shanks, MD  HYDROcodone-acetaminophen (NORCO/VICODIN) 5-325 MG per tablet  Take 1-2 tablets by mouth every 4 (four) hours as needed for moderate pain. 08/22/14   Delfina Redwood, MD  HYDROcodone-acetaminophen (NORCO/VICODIN) 5-325 MG per tablet Take 1-2 tablets by mouth every 4 (four) hours as needed for moderate pain or severe pain. 01/09/15   Charlesetta Shanks, MD  LORazepam (ATIVAN) 0.5 MG tablet Take 1 tablet (0.5 mg total) by mouth 2 (two) times daily as needed for anxiety (restlessness). 08/22/14   Delfina Redwood, MD  metFORMIN (GLUCOPHAGE-XR) 500 MG 24 hr tablet Take 1 tablet (500 mg total) by mouth daily with breakfast. 01/02/15   Renato Shin, MD  metroNIDAZOLE (FLAGYL) 500 MG tablet Take 1 tablet (500 mg total) by mouth 2 (two) times daily. One po bid x 7 days 01/09/15   Charlesetta Shanks, MD  Multiple Vitamin (MULTIVITAMIN WITH MINERALS) TABS tablet Take 1 tablet by mouth daily.    Historical Provider, MD  omega-3 acid ethyl esters (LOVAZA) 1 G capsule Take 1 g by mouth daily.     Historical Provider, MD  OVER THE COUNTER MEDICATION Take 1 tablet by mouth daily.    Historical Provider, MD  OVER THE COUNTER MEDICATION Take 2 capsules by mouth daily. Arthro-7    Historical Provider, MD  oxyCODONE-acetaminophen (PERCOCET) 10-325 MG per tablet Take 1 tablet by mouth every 6 (six) hours as needed for pain. Patient not taking: Reported on 01/02/2015 09/25/14   Arta Silence, MD  pantoprazole (PROTONIX) 40 MG tablet Take 1 tablet (40 mg total) by mouth daily. Patient not taking: Reported on 01/02/2015 08/22/14   Delfina Redwood, MD  PRESCRIPTION MEDICATION Apply 1 application topically daily as needed (For rash.). Clindamycin Cream    Historical Provider, MD  promethazine (PHENERGAN) 12.5 MG tablet Take 1 tablet (12.5 mg total) by mouth every 6 (six) hours as needed for nausea or vomiting. Patient not taking: Reported on 09/25/2014 08/22/14   Delfina Redwood, MD  valsartan-hydrochlorothiazide (DIOVAN-HCT) 80-12.5 MG per tablet Take 1 tablet by mouth every morning.      Historical Provider, MD   Triage Vitals: BP 174/84 mmHg  Pulse 74  Temp(Src) 97.8 F (36.6 C) (Oral)  Resp 18  Ht 5\' 5"  (1.651 m)  Wt 212 lb (96.163 kg)  BMI 35.28 kg/m2  SpO2 98%  Physical Exam  Constitutional: She is oriented to person, place, and time. She appears well-developed and well-nourished. No distress.  HENT:  Head: Normocephalic and atraumatic.  Nose: Nose normal.  Mouth/Throat: Oropharynx is clear and moist.  Eyes: Conjunctivae and EOM are normal. Pupils are equal, round, and reactive to light.  Neck: Normal range of motion. Neck supple. No JVD present. No tracheal deviation present. No thyromegaly present.  Cardiovascular: Normal rate, regular rhythm, normal heart sounds and intact distal pulses.  Exam reveals no gallop and no friction rub.   No murmur  heard. Pulmonary/Chest: Effort normal and breath sounds normal. No stridor. No respiratory distress. She has no wheezes. She has no rales. She exhibits no tenderness.  Abdominal: Soft. Bowel sounds are normal. She exhibits no distension and no mass. There is no tenderness. There is no rebound and no guarding.  Musculoskeletal: Normal range of motion. She exhibits no edema or tenderness.  Lymphadenopathy:    She has no cervical adenopathy.  Neurological: She is alert and oriented to person, place, and time. She displays normal reflexes. She exhibits normal muscle tone. Coordination normal.  Skin: Skin is warm and dry. No rash noted. No erythema. No pallor.  Psychiatric: She has a normal mood and affect. Her behavior is normal. Judgment and thought content normal.  Nursing note and vitals reviewed.   ED Course  Procedures (including critical care time)  DIAGNOSTIC STUDIES: Oxygen Saturation is 98% on room air, normal by my interpretation.    COORDINATION OF CARE: At 0004 Discussed treatment plan with patient which includes nitroglycerin and aspirin. Patient agrees.   Labs Review Labs Reviewed  BASIC METABOLIC  PANEL - Abnormal; Notable for the following:    Glucose, Bld 140 (*)    All other components within normal limits  CBC WITH DIFFERENTIAL/PLATELET - Abnormal; Notable for the following:    Platelets 408 (*)    All other components within normal limits  TROPONIN I  I-STAT TROPOININ, ED    Imaging Review Dg Chest 2 View  01/26/2015   CLINICAL DATA:  Chest pain and hypertension.  EXAM: CHEST  2 VIEW  COMPARISON:  Radiographs and CT 02/11/2014  FINDINGS: Heart is at the upper limits of normal in size, mediastinal contours are unchanged. Central bronchial thickening is unchanged from prior exam. Pulmonary vasculature is normal. No consolidation, pleural effusion, or pneumothorax. No acute osseous abnormalities are seen.  IMPRESSION: Mild bronchial thickening that appears chronic. No acute pulmonary process.   Electronically Signed   By: Jeb Levering M.D.   On: 01/26/2015 23:05     EKG Interpretation   Date/Time:  Sunday January 26 2015 22:05:34 EDT Ventricular Rate:  90 PR Interval:  142 QRS Duration: 78 QT Interval:  366 QTC Calculation: 447 R Axis:   62 Text Interpretation:  Normal sinus rhythm Possible Left atrial enlargement  Borderline ECG Confirmed by Tallyn Holroyd  MD, Conchetta Lamia ([id number]) on 01/26/2015 11:42:58  PM      MDM   Final diagnoses:  Hypertensive urgency  Chest pain with moderate risk for cardiac etiology    64 year old female with upper central chest pain associated with hypertension today.  Patient missed Diovan dosing, but doubled her Norvasc.  EKG without ischemia.  Pain resolved with nitroglycerin and reduction in her blood pressure.  Concern for hypertensive urgency.  Plan for admission with cycling of enzymes.  I personally performed the services described in this documentation, which was scribed in my presence. The recorded information has been reviewed and is accurate.    Samantha Flemings, MD 01/27/15 [phone]

## 2015-01-27 DIAGNOSIS — I10 Essential (primary) hypertension: Secondary | ICD-10-CM

## 2015-01-27 DIAGNOSIS — I160 Hypertensive urgency: Secondary | ICD-10-CM | POA: Diagnosis present

## 2015-01-27 DIAGNOSIS — E118 Type 2 diabetes mellitus with unspecified complications: Secondary | ICD-10-CM | POA: Diagnosis not present

## 2015-01-27 DIAGNOSIS — R079 Chest pain, unspecified: Secondary | ICD-10-CM

## 2015-01-27 LAB — TROPONIN I: Troponin I: <0.03 ng/mL (ref <0.031)

## 2015-01-27 LAB — GLUCOSE, CAPILLARY: GLUCOSE-CAPILLARY: 96 mg/dL (ref 65–99)

## 2015-01-27 MED ORDER — NITROGLYCERIN 0.4 MG SL SUBL: 0.4000 mg | SUBLINGUAL_TABLET | SUBLINGUAL | Status: DC | PRN

## 2015-01-27 MED ORDER — AMLODIPINE BESYLATE 5 MG PO TABS: 5.0000 mg | ORAL_TABLET | Freq: Every day | ORAL | Status: DC

## 2015-01-27 MED ORDER — ASPIRIN EC 81 MG PO TBEC: 81.0000 mg | DELAYED_RELEASE_TABLET | Freq: Every day | ORAL | Status: DC

## 2015-01-27 MED ORDER — VALSARTAN-HYDROCHLOROTHIAZIDE 80-12.5 MG PO TABS: 1.0000 | ORAL_TABLET | Freq: Every morning | ORAL | Status: DC

## 2015-01-27 MED ORDER — NITROGLYCERIN 0.4 MG SL SUBL
0.4000 mg | SUBLINGUAL_TABLET | SUBLINGUAL | Status: DC | PRN
  Administered 2015-01-27 (×2): 0.4 mg via SUBLINGUAL
  Filled 2015-01-27: qty 1

## 2015-01-27 MED ORDER — ALPRAZOLAM 0.25 MG PO TABS: 0.1250 mg | ORAL_TABLET | Freq: Every day | ORAL | Status: DC | PRN

## 2015-01-27 MED ORDER — HYDRALAZINE HCL 20 MG/ML IJ SOLN: 10.0000 mg | INTRAMUSCULAR | Status: DC | PRN

## 2015-01-27 MED ORDER — METFORMIN HCL ER 500 MG PO TB24
500.0000 mg | ORAL_TABLET | Freq: Every day | ORAL | Status: DC
Start: 2015-01-27 — End: 2015-01-27
  Administered 2015-01-27: 500 mg via ORAL
  Filled 2015-01-27 (×2): qty 1

## 2015-01-27 MED ORDER — AMLODIPINE BESYLATE 5 MG PO TABS
5.0000 mg | ORAL_TABLET | Freq: Every day | ORAL | Status: DC
  Administered 2015-01-27: 5 mg via ORAL
  Filled 2015-01-27: qty 1

## 2015-01-27 MED ORDER — ASPIRIN EC 325 MG PO TBEC
325.0000 mg | DELAYED_RELEASE_TABLET | Freq: Every day | ORAL | Status: DC
  Administered 2015-01-27 (×2): 325 mg via ORAL
  Filled 2015-01-27 (×2): qty 1

## 2015-01-27 MED ORDER — SODIUM CHLORIDE 0.9 % IJ SOLN
3.0000 mL | Freq: Two times a day (BID) | INTRAMUSCULAR | Status: DC
  Administered 2015-01-27 (×2): 3 mL via INTRAVENOUS

## 2015-01-27 MED ORDER — IRBESARTAN 75 MG PO TABS
75.0000 mg | ORAL_TABLET | Freq: Every day | ORAL | Status: DC
  Administered 2015-01-27: 75 mg via ORAL
  Filled 2015-01-27: qty 1

## 2015-01-27 MED ORDER — HYDROCHLOROTHIAZIDE 12.5 MG PO CAPS
12.5000 mg | ORAL_CAPSULE | Freq: Every day | ORAL | Status: DC
  Administered 2015-01-27: 12.5 mg via ORAL
  Filled 2015-01-27: qty 1

## 2015-01-27 MED ORDER — HEPARIN SODIUM (PORCINE) 5000 UNIT/ML IJ SOLN
5000.0000 [IU] | Freq: Three times a day (TID) | INTRAMUSCULAR | Status: DC
  Administered 2015-01-27: 5000 [IU] via SUBCUTANEOUS
  Filled 2015-01-27 (×3): qty 1

## 2015-01-27 NOTE — Progress Notes (Signed)
Pt has orders to be discharged. Discharge instructions given and pt has no additional questions at this time. Medication regimen reviewed and pt educated. Pt verbalized understanding and has no additional questions. Telemetry box removed. IV removed and site in good condition. Pt stable and waiting for transportation.   Remmi Armenteros RN 

## 2015-01-27 NOTE — H&P (Signed)
[facility] History and Physical  Merna Baldi YBO:[id number] DOB: 16-Mar-1951 DOA: 01/26/2015  Referring physician: EDP PCP: Antony Blackbird, MD   Chief Complaint: Chest pain, HTN   HPI: [name] is a 64 y.o. female with h/o HTN.  She ran out of her Diovan today.  She developed chest pain this evening, took her BP 3 times at home with readings between 277 and 824 systolic.  EMS was called, gave her NTG SL.  Her CP resolved, and her BP had gone down to 130 by the time of arrival.  Review of Systems: Systems reviewed.  As above, otherwise negative  Past Medical History  Diagnosis Date  . Hypertension   . High cholesterol     diet controlled - no meds  . SVD (spontaneous vaginal delivery)     x 2  . GERD (gastroesophageal reflux disease)     occas.diet contolled - no meds  . Fibroids   . Headache     "weekly; related to BP" (08/20/2014)  . Arthritis     "knees" (08/20/2014)  . Situational anxiety 2012-11-30    "son passed away"  . Dry eyes     recent treatment  . Diverticulitis    Past Surgical History  Procedure Laterality Date  . Colonoscopy      x3 polyps removed  . Cholecystectomy  1970  . Eus N/A 09/25/2014    Procedure: ESOPHAGEAL ENDOSCOPIC ULTRASOUND (EUS) RADIAL;  Surgeon: Arta Silence, MD;  Location: [facility];  Service: Endoscopy;  Laterality: N/A;  . Fine needle aspiration N/A 09/25/2014    Procedure: FINE NEEDLE ASPIRATION (FNA) LINEAR;  Surgeon: Arta Silence, MD;  Location: [facility];  Service: Endoscopy;  Laterality: N/A;  = or- fna   Social History:  reports that she quit smoking about 29 years ago. Her smoking use included Cigarettes. She has a 5 pack-year smoking history. She has never used smokeless tobacco. She reports that she does not drink alcohol or use illicit drugs.  No Known Allergies  Family History  Problem Relation Age of Onset  . Diabetes Maternal Grandmother      Prior to Admission medications   Medication Sig Start Date End  Date Taking? Authorizing Provider  ALPRAZolam (XANAX) 0.25 MG tablet Take 0.125 mg by mouth daily as needed for anxiety.    Historical Provider, MD  Cholecalciferol (VITAMIN D-3 PO) Take 1 capsule by mouth daily.    Historical Provider, MD  HYDROcodone-acetaminophen (NORCO/VICODIN) 5-325 MG per tablet Take 1-2 tablets by mouth every 4 (four) hours as needed for moderate pain or severe pain. 01/09/15   Charlesetta Shanks, MD  LORazepam (ATIVAN) 0.5 MG tablet Take 1 tablet (0.5 mg total) by mouth 2 (two) times daily as needed for anxiety (restlessness). 08/22/14   Delfina Redwood, MD  metFORMIN (GLUCOPHAGE-XR) 500 MG 24 hr tablet Take 1 tablet (500 mg total) by mouth daily with breakfast. 01/02/15   Renato Shin, MD  Multiple Vitamin (MULTIVITAMIN WITH MINERALS) TABS tablet Take 1 tablet by mouth daily.    Historical Provider, MD  omega-3 acid ethyl esters (LOVAZA) 1 G capsule Take 1 g by mouth daily.     Historical Provider, MD  OVER THE COUNTER MEDICATION Take 1 tablet by mouth daily.    Historical Provider, MD  OVER THE COUNTER MEDICATION Take 2 capsules by mouth daily. Arthro-7    Historical Provider, MD  PRESCRIPTION MEDICATION Apply 1 application topically daily as needed (For rash.). Clindamycin Cream    Historical Provider, MD  valsartan-hydrochlorothiazide (DIOVAN-HCT) 80-12.5 MG per tablet Take 1 tablet by mouth every morning.     Historical Provider, MD   Physical Exam: Filed Vitals:   01/27/15 0122  BP: 142/89  Pulse: 74  Temp:   Resp: 14    BP 142/89 mmHg  Pulse 74  Temp(Src) 97.8 F (36.6 C) (Oral)  Resp 14  Ht 5\' 5"  (1.651 m)  Wt 96.163 kg (212 lb)  BMI 35.28 kg/m2  SpO2 98%  General Appearance:    Alert, oriented, no distress, appears stated age  Head:    Normocephalic, atraumatic  Eyes:    PERRL, EOMI, sclera non-icteric        Nose:   Nares without drainage or epistaxis. Mucosa, turbinates normal  Throat:   Moist mucous membranes. Oropharynx without erythema or  exudate.  Neck:   Supple. No carotid bruits.  No thyromegaly.  No lymphadenopathy.   Back:     No CVA tenderness, no spinal tenderness  Lungs:     Clear to auscultation bilaterally, without wheezes, rhonchi or rales  Chest wall:    No tenderness to palpitation  Heart:    Regular rate and rhythm without murmurs, gallops, rubs  Abdomen:     Soft, non-tender, nondistended, normal bowel sounds, no organomegaly  Genitalia:    deferred  Rectal:    deferred  Extremities:   No clubbing, cyanosis or edema.  Pulses:   2+ and symmetric all extremities  Skin:   Skin color, texture, turgor normal, no rashes or lesions  Lymph nodes:   Cervical, supraclavicular, and axillary nodes normal  Neurologic:   CNII-XII intact. Normal strength, sensation and reflexes      throughout    Labs on Admission:  Basic Metabolic Panel:  Recent Labs Lab 01/26/15 2210  NA 140  K 3.8  CL 105  CO2 26  GLUCOSE 140*  BUN 12  CREATININE 0.82  CALCIUM 8.9   Liver Function Tests: No results for input(s): AST, ALT, ALKPHOS, BILITOT, PROT, ALBUMIN in the last 168 hours. No results for input(s): LIPASE, AMYLASE in the last 168 hours. No results for input(s): AMMONIA in the last 168 hours. CBC:  Recent Labs Lab 01/26/15 2210  WBC 5.4  NEUTROABS 2.5  HGB 13.1  HCT 38.3  MCV 86.3  PLT 408*   Cardiac Enzymes: No results for input(s): CKTOTAL, CKMB, CKMBINDEX, TROPONINI in the last 168 hours.  BNP (last 3 results) No results for input(s): PROBNP in the last 8760 hours. CBG: No results for input(s): GLUCAP in the last 168 hours.  Radiological Exams on Admission: Dg Chest 2 View  01/26/2015   CLINICAL DATA:  Chest pain and hypertension.  EXAM: CHEST  2 VIEW  COMPARISON:  Radiographs and CT 02/11/2014  FINDINGS: Heart is at the upper limits of normal in size, mediastinal contours are unchanged. Central bronchial thickening is unchanged from prior exam. Pulmonary vasculature is normal. No consolidation, pleural  effusion, or pneumothorax. No acute osseous abnormalities are seen.  IMPRESSION: Mild bronchial thickening that appears chronic. No acute pulmonary process.   Electronically Signed   By: Jeb Levering M.D.   On: 01/26/2015 23:05    EKG: Independently reviewed.  Assessment/Plan Active Problems:   Hypertensive urgency   1. Hypertensive urgency - BP controlled after NTG 1. Restart home diovan 2. Hydralazine for HTN PRN 3. Tele monitor    Code Status: Full Code  Family Communication: Family at bedside Disposition Plan: Admit to obs   Time spent: 27  min  Nataly Pacifico M. [facility] Pager [phone]  If 7AM-7PM, please contact the day team taking care of the patient [email or web address] Password [email or web address] 01/27/2015, 1:54 AM

## 2015-01-27 NOTE — Discharge Summary (Signed)
Physician Discharge Summary  [name] JDB:[id number] DOB: [birth year] DOA: 01/26/2015  PCP: Antony Blackbird, MD  Admit date: 01/26/2015 Discharge date: 01/27/2015  Time spent: 40 minutes  Recommendations for Outpatient Follow-up:  Patient will be discharged to home.  Patient will need to follow up with primary care provider within one week of discharge.  Patient will be contacted by the cardiology for outpatient follow up. Patient should continue medications as prescribed.  Patient should follow a heart healthy/carb modified diet.   Discharge Diagnoses:  Accelerated hypertension Chest pain Diabetes mellitus, type 2  Discharge Condition: Stable  Diet recommendation: Heart healthy/carb modified  Filed Weights   01/26/15 2205 01/27/15 0318  Weight: 96.163 kg (212 lb) 96.072 kg (211 lb 12.8 oz)    History of present illness:  On 01/27/2015 by Dr. Jennette Kettle [name] is a 64 y.o. female with h/o HTN. She ran out of her Diovan today. She developed chest pain this evening, took her BP 3 times at home with readings between 612 and 244 systolic. EMS was called, gave her NTG SL. Her CP resolved, and her BP had gone down to 130 by the time of arrival.  Hospital Course:  Accelerated hypertension -Patient out of her meds (due to cost) -case management consulted for medication assistance -Patient states she cannot take generic medications.   -Advised patient to speak with her PCP, maybe a prior authorization is needed for brand meds -Continue home medications, Diovan and amlodipine  Chest pain -resolved -Likely secondary to HTN -Troponin cycled, negative -Spoke with cardiology for outpatient follow up, office will contact patient.  Diabetes mellitus, type 2 -Continue metfromin  Procedures: None  Consultations: None  Discharge Exam: Filed Vitals:   01/27/15 1403  BP: 136/87  Pulse: 87  Temp: 98 F (36.7 C)  Resp: 16     General: Well developed, well nourished,  NAD, appears younger than stated age  HEENT: NCAT, mucous membranes moist.  Cardiovascular: S1 S2 auscultated, no rubs, murmurs or gallops. Regular rate and rhythm.  Respiratory: Clear to auscultation bilaterally with equal chest rise  Abdomen: Soft, nontender, nondistended, + bowel sounds  Extremities: warm dry without cyanosis clubbing or edema  Neuro: AAOx3, cranial nerves grossly intact. Strength 5/5 in patient's upper and lower extremities bilaterally  Psych: Normal affect and demeanor with intact judgement and insight  Discharge Instructions      Discharge Instructions    Discharge instructions    Complete by:  As directed   Patient will be discharged to home.  Patient will need to follow up with primary care provider within one week of discharge.  Patient will be contacted by the cardiology for outpatient follow up. Patient should continue medications as prescribed.  Patient should follow a heart healthy/carb modified diet.            Medication List    STOP taking these medications        HYDROcodone-acetaminophen 5-325 MG per tablet  Commonly known as:  NORCO/VICODIN      TAKE these medications        acetaminophen 500 MG tablet  Commonly known as:  TYLENOL  Take 500 mg by mouth every 8 (eight) hours as needed for mild pain.     amLODipine 5 MG tablet  Commonly known as:  NORVASC  Take 1 tablet by mouth daily.     aspirin EC 81 MG tablet  Take 1 tablet (81 mg total) by mouth daily.     GARLIC PO  Take 1 capsule by mouth daily.     LORazepam 0.5 MG tablet  Commonly known as:  ATIVAN  Take 1 tablet (0.5 mg total) by mouth 2 (two) times daily as needed for anxiety (restlessness).     metFORMIN 500 MG 24 hr tablet  Commonly known as:  GLUCOPHAGE-XR  Take 1 tablet (500 mg total) by mouth daily with breakfast.     multivitamin with minerals Tabs tablet  Take 1 tablet by mouth daily.     nitroGLYCERIN 0.4 MG SL tablet  Commonly known as:  NITROSTAT    Place 1 tablet (0.4 mg total) under the tongue every 5 (five) minutes as needed for chest pain.     omega-3 acid ethyl esters 1 G capsule  Commonly known as:  LOVAZA  Take 1 g by mouth daily.     OVER THE COUNTER MEDICATION  Take 1 tablet by mouth daily.     valsartan-hydrochlorothiazide 80-12.5 MG per tablet  Commonly known as:  DIOVAN-HCT  Take 1 tablet by mouth every morning.       No Known Allergies Follow-up Information    Call [facility].   Specialty:  Cardiology   Why:  Office will contact you for appointment   Contact information:   [address] [place] [address] [phone]      Follow up with Antony Blackbird, MD.   Specialty:  Family Medicine   Why:  Hospital follow up, hypertension   Contact information:   [address] [place] [address] [phone]        The results of significant diagnostics from this hospitalization (including imaging, microbiology, ancillary and laboratory) are listed below for reference.    Significant Diagnostic Studies: Dg Chest 2 View  01/26/2015   CLINICAL DATA:  Chest pain and hypertension.  EXAM: CHEST  2 VIEW  COMPARISON:  Radiographs and CT 02/11/2014  FINDINGS: Heart is at the upper limits of normal in size, mediastinal contours are unchanged. Central bronchial thickening is unchanged from prior exam. Pulmonary vasculature is normal. No consolidation, pleural effusion, or pneumothorax. No acute osseous abnormalities are seen.  IMPRESSION: Mild bronchial thickening that appears chronic. No acute pulmonary process.   Electronically Signed   By: Jeb Levering M.D.   On: 01/26/2015 23:05    Microbiology: No results found for this or any previous visit (from the past 240 hour(s)).   Labs: Basic Metabolic Panel:  Recent Labs Lab 01/26/15 2210  NA 140  K 3.8  CL 105  CO2 26  GLUCOSE 140*  BUN 12  CREATININE 0.82  CALCIUM 8.9   Liver Function Tests: No  results for input(s): AST, ALT, ALKPHOS, BILITOT, PROT, ALBUMIN in the last 168 hours. No results for input(s): LIPASE, AMYLASE in the last 168 hours. No results for input(s): AMMONIA in the last 168 hours. CBC:  Recent Labs Lab 01/26/15 2210  WBC 5.4  NEUTROABS 2.5  HGB 13.1  HCT 38.3  MCV 86.3  PLT 408*   Cardiac Enzymes:  Recent Labs Lab 01/27/15 0105 01/27/15 1308  TROPONINI <0.03 <0.03   BNP: BNP (last 3 results) No results for input(s): BNP in the last 8760 hours.  ProBNP (last 3 results) No results for input(s): PROBNP in the last 8760 hours.  CBG:  Recent Labs Lab 01/27/15 0621  GLUCAP 96       Signed:  Cristal Ford  [facility] 01/27/2015, 2:24 PM

## 2015-01-27 NOTE — ED Notes (Signed)
Attempted report 

## 2015-01-27 NOTE — Discharge Instructions (Signed)

## 2015-04-19 ENCOUNTER — Inpatient Hospital Stay: Admit: 2015-04-19 | Payer: Self-pay | Admitting: General Surgery

## 2015-04-19 SURGERY — PANCREATECTOMY, LAPAROSCOPIC
Anesthesia: General

## 2015-08-08 ENCOUNTER — Encounter (HOSPITAL_COMMUNITY): Payer: Self-pay | Admitting: *Deleted

## 2015-08-08 ENCOUNTER — Emergency Department (HOSPITAL_COMMUNITY): Payer: BLUE CROSS/BLUE SHIELD

## 2015-08-08 ENCOUNTER — Observation Stay (HOSPITAL_COMMUNITY)
Admission: EM | Admit: 2015-08-08 | Discharge: 2015-08-10 | Disposition: A | Discharge status: Home / Self Care | Payer: BLUE CROSS/BLUE SHIELD | Attending: Internal Medicine | Admitting: Internal Medicine

## 2015-08-08 DIAGNOSIS — N179 Acute kidney failure, unspecified: Principal | ICD-10-CM | POA: Diagnosis present

## 2015-08-08 DIAGNOSIS — E669 Obesity, unspecified: Secondary | ICD-10-CM | POA: Diagnosis not present

## 2015-08-08 DIAGNOSIS — M170 Bilateral primary osteoarthritis of knee: Secondary | ICD-10-CM | POA: Diagnosis not present

## 2015-08-08 DIAGNOSIS — I160 Hypertensive urgency: Secondary | ICD-10-CM | POA: Diagnosis present

## 2015-08-08 DIAGNOSIS — K5732 Diverticulitis of large intestine without perforation or abscess without bleeding: Secondary | ICD-10-CM | POA: Diagnosis not present

## 2015-08-08 DIAGNOSIS — Z6830 Body mass index (BMI) 30.0-30.9, adult: Secondary | ICD-10-CM | POA: Insufficient documentation

## 2015-08-08 DIAGNOSIS — E119 Type 2 diabetes mellitus without complications: Secondary | ICD-10-CM

## 2015-08-08 DIAGNOSIS — Z7984 Long term (current) use of oral hypoglycemic drugs: Secondary | ICD-10-CM | POA: Diagnosis not present

## 2015-08-08 DIAGNOSIS — K219 Gastro-esophageal reflux disease without esophagitis: Secondary | ICD-10-CM | POA: Insufficient documentation

## 2015-08-08 DIAGNOSIS — E7800 Pure hypercholesterolemia, unspecified: Secondary | ICD-10-CM | POA: Insufficient documentation

## 2015-08-08 DIAGNOSIS — I10 Essential (primary) hypertension: Secondary | ICD-10-CM | POA: Diagnosis present

## 2015-08-08 DIAGNOSIS — Z79899 Other long term (current) drug therapy: Secondary | ICD-10-CM | POA: Insufficient documentation

## 2015-08-08 DIAGNOSIS — Z87891 Personal history of nicotine dependence: Secondary | ICD-10-CM | POA: Diagnosis not present

## 2015-08-08 DIAGNOSIS — E0800 Diabetes mellitus due to underlying condition with hyperosmolarity without nonketotic hyperglycemic-hyperosmolar coma (NKHHC): Secondary | ICD-10-CM | POA: Diagnosis not present

## 2015-08-08 LAB — BRAIN NATRIURETIC PEPTIDE: B Natriuretic Peptide: 81.6 pg/mL (ref 0.0–100.0)

## 2015-08-08 LAB — BASIC METABOLIC PANEL
ANION GAP: 9 (ref 5–15)
BUN: 29 mg/dL — ABNORMAL HIGH (ref 6–20)
CO2: 28 mmol/L (ref 22–32)
Calcium: 9.3 mg/dL (ref 8.9–10.3)
Chloride: 104 mmol/L (ref 101–111)
Creatinine, Ser: 2.4 mg/dL — ABNORMAL HIGH (ref 0.44–1.00)
GFR calc non Af Amer: 20 mL/min — ABNORMAL LOW (ref >60)
GFR, EST AFRICAN AMERICAN: 23 mL/min — AB (ref >60)
Glucose, Bld: 124 mg/dL — ABNORMAL HIGH (ref 65–99)
Potassium: 4.1 mmol/L (ref 3.5–5.1)
Sodium: 141 mmol/L (ref 135–145)

## 2015-08-08 LAB — CBC WITH DIFFERENTIAL/PLATELET
BASOS ABS: 0 10*3/uL (ref 0.0–0.1)
BASOS PCT: 0 %
Eosinophils Absolute: 0.1 10*3/uL (ref 0.0–0.7)
Eosinophils Relative: 1 %
HEMATOCRIT: 39.5 % (ref 36.0–46.0)
Hemoglobin: 13 g/dL (ref 12.0–15.0)
Lymphocytes Relative: 18 %
Lymphs Abs: 1.4 10*3/uL (ref 0.7–4.0)
MCH: 28.4 pg (ref 26.0–34.0)
MCHC: 32.9 g/dL (ref 30.0–36.0)
MCV: 86.2 fL (ref 78.0–100.0)
MONOS PCT: 13 %
Monocytes Absolute: 1 10*3/uL (ref 0.1–1.0)
NEUTROS ABS: 5.2 10*3/uL (ref 1.7–7.7)
NEUTROS PCT: 68 %
Platelets: 435 10*3/uL — ABNORMAL HIGH (ref 150–400)
RBC: 4.58 MIL/uL (ref 3.87–5.11)
RDW: 14.3 % (ref 11.5–15.5)
WBC: 7.6 10*3/uL (ref 4.0–10.5)

## 2015-08-08 LAB — URINE MICROSCOPIC-ADD ON

## 2015-08-08 LAB — CK: CK TOTAL: 93 U/L (ref 38–234)

## 2015-08-08 LAB — GLUCOSE, CAPILLARY
GLUCOSE-CAPILLARY: 125 mg/dL — AB (ref 65–99)
GLUCOSE-CAPILLARY: 111 mg/dL — AB (ref 65–99)

## 2015-08-08 LAB — URINALYSIS, ROUTINE W REFLEX MICROSCOPIC
BILIRUBIN URINE: NEGATIVE
GLUCOSE, UA: NEGATIVE mg/dL
Ketones, ur: NEGATIVE mg/dL
Nitrite: NEGATIVE
PH: 6 (ref 5.0–8.0)
Protein, ur: NEGATIVE mg/dL
SPECIFIC GRAVITY, URINE: 1.007 (ref 1.005–1.030)

## 2015-08-08 MED ORDER — ENOXAPARIN SODIUM 30 MG/0.3ML ~~LOC~~ SOLN
30.0000 mg | SUBCUTANEOUS | Status: DC
  Administered 2015-08-09: 30 mg via SUBCUTANEOUS
  Filled 2015-08-08: qty 0.3

## 2015-08-08 MED ORDER — CIPROFLOXACIN HCL 250 MG PO TABS: 250.0000 mg | ORAL_TABLET | Freq: Two times a day (BID) | ORAL | Status: DC

## 2015-08-08 MED ORDER — INSULIN ASPART 100 UNIT/ML ~~LOC~~ SOLN: 0.0000 [IU] | Freq: Three times a day (TID) | SUBCUTANEOUS | Status: DC

## 2015-08-08 MED ORDER — CIPROFLOXACIN HCL 250 MG PO TABS
250.0000 mg | ORAL_TABLET | Freq: Every day | ORAL | Status: DC
  Administered 2015-08-08 – 2015-08-10 (×3): 250 mg via ORAL
  Filled 2015-08-08 (×3): qty 1

## 2015-08-08 MED ORDER — INSULIN ASPART 100 UNIT/ML ~~LOC~~ SOLN: 0.0000 [IU] | Freq: Every day | SUBCUTANEOUS | Status: DC

## 2015-08-08 MED ORDER — ACETAMINOPHEN 500 MG PO TABS
500.0000 mg | ORAL_TABLET | Freq: Three times a day (TID) | ORAL | Status: DC | PRN
  Administered 2015-08-09 (×2): 500 mg via ORAL
  Filled 2015-08-08 (×2): qty 1

## 2015-08-08 MED ORDER — HYDRALAZINE HCL 20 MG/ML IJ SOLN
5.0000 mg | Freq: Four times a day (QID) | INTRAMUSCULAR | Status: DC | PRN
  Filled 2015-08-08: qty 1

## 2015-08-08 MED ORDER — NITROGLYCERIN 1 MG/10 ML FOR IR/CATH LAB
INTRA_ARTERIAL | Status: AC
  Filled 2015-08-08: qty 10

## 2015-08-08 MED ORDER — HEPARIN (PORCINE) IN NACL 2-0.9 UNIT/ML-% IJ SOLN
INTRAMUSCULAR | Status: AC
  Filled 2015-08-08: qty 1500

## 2015-08-08 MED ORDER — METRONIDAZOLE 500 MG PO TABS
500.0000 mg | ORAL_TABLET | Freq: Three times a day (TID) | ORAL | Status: DC
  Administered 2015-08-08 – 2015-08-10 (×6): 500 mg via ORAL
  Filled 2015-08-08 (×7): qty 1

## 2015-08-08 MED ORDER — SODIUM CHLORIDE 0.9 % IV BOLUS (SEPSIS)
1000.0000 mL | Freq: Once | INTRAVENOUS | Status: AC
  Administered 2015-08-08: 1000 mL via INTRAVENOUS

## 2015-08-08 MED ORDER — SODIUM CHLORIDE 0.9 % IV SOLN
INTRAVENOUS | Status: DC
  Administered 2015-08-08 – 2015-08-10 (×6): via INTRAVENOUS

## 2015-08-08 MED ORDER — VERAPAMIL HCL 2.5 MG/ML IV SOLN
INTRAVENOUS | Status: AC
  Filled 2015-08-08: qty 2

## 2015-08-08 MED ORDER — AMLODIPINE BESYLATE 5 MG PO TABS
5.0000 mg | ORAL_TABLET | Freq: Every day | ORAL | Status: DC
  Administered 2015-08-08 – 2015-08-10 (×3): 5 mg via ORAL
  Filled 2015-08-08 (×3): qty 1

## 2015-08-08 MED ORDER — LIDOCAINE HCL (PF) 1 % IJ SOLN
INTRAMUSCULAR | Status: AC
  Filled 2015-08-08: qty 30

## 2015-08-08 NOTE — ED Notes (Signed)
Pt transported to and from CT scanner on stretcher with tech, tolerated well. 

## 2015-08-08 NOTE — ED Notes (Signed)
Pt reports dizziness and left eye vision changes since 1115 today. Pt reports her BP has been elevated for several days as well. Pt noted to have left eye droop as well. No other neuro deficits noted.

## 2015-08-08 NOTE — ED Provider Notes (Signed)
CSN: [id number]     Arrival date & time 08/08/15  1212-12-03 History   First MD Initiated Contact with Patient 08/08/15 1239     Chief Complaint  Patient presents with  . Hypertension     (Consider location/radiation/quality/duration/timing/severity/associated sxs/prior Treatment) HPI  89 old female presents with hypertension. Patient states that about one hour prior to arrival she had gradual lightheadedness. Felt like she might pass out, especially with standing. Did not have any chest pain or shortness of breath. No palpitations. Son checked her blood pressure and it was 204/104. Similar numbers on multiple rechecks. Patient was delayed in taking her morning medicines so she took both of her hypertensive medicines and then came to the ER. Patient also noticed around that time that she developed spots in her left eye. She also had some mild pain around her eye and a left-sided headache. Denies any weakness or numbness. Patient states it was no visual deficit or trouble seeing but it seemed like hard to describe spots were in her eyes. This has resolved. Headache is also gone and there is no ocular pain now. She is unsure if closing her right eye or closing her left eye change the symptoms.  Patient also notes she's been having a diverticulitis flare over the last few days. She is currently on antibiotics. Patient states that her abdominal pain is slowly improving but it seems like it is slower than normal. No fevers. No vomiting.  Past Medical History  Diagnosis Date  . Hypertension   . High cholesterol     diet controlled - no meds  . SVD (spontaneous vaginal delivery)     x 2  . GERD (gastroesophageal reflux disease)     occas.diet contolled - no meds  . Fibroids   . Headache     "weekly; related to BP" (08/20/2014)  . Arthritis     "knees" (08/20/2014)  . Situational anxiety 12/03/12    "son passed away"  . Dry eyes     recent treatment  . Diverticulitis    Past Surgical History   Procedure Laterality Date  . Colonoscopy      x3 polyps removed  . Cholecystectomy  1970  . Eus N/A 09/25/2014    Procedure: ESOPHAGEAL ENDOSCOPIC ULTRASOUND (EUS) RADIAL;  Surgeon: Arta Silence, MD;  Location: [facility];  Service: Endoscopy;  Laterality: N/A;  . Fine needle aspiration N/A 09/25/2014    Procedure: FINE NEEDLE ASPIRATION (FNA) LINEAR;  Surgeon: Arta Silence, MD;  Location: [facility];  Service: Endoscopy;  Laterality: N/A;  = or- fna   Family History  Problem Relation Age of Onset  . Diabetes Maternal Grandmother    Social History  Substance Use Topics  . Smoking status: Former Smoker -- 0.50 packs/day for 10 years    Types: Cigarettes    Quit date: 04/01/1985  . Smokeless tobacco: Never Used  . Alcohol Use: No   OB History    No data available     Review of Systems  Eyes: Positive for visual disturbance.  Respiratory: Negative for shortness of breath.   Cardiovascular: Negative for chest pain and palpitations.  Gastrointestinal: Negative for vomiting.  Neurological: Positive for dizziness, light-headedness and headaches. Negative for weakness and numbness.  All other systems reviewed and are negative.     Allergies  Review of patient's allergies indicates no known allergies.  Home Medications   Prior to Admission medications   Medication Sig Start Date End Date Taking? Authorizing Provider  acetaminophen (TYLENOL)  500 MG tablet Take 500 mg by mouth every 8 (eight) hours as needed for mild pain.   Yes Historical Provider, MD  amLODipine (NORVASC) 5 MG tablet Take 1 tablet (5 mg total) by mouth daily. 01/27/15  Yes Maryann Mikhail, DO  GARLIC PO Take 1 capsule by mouth daily.   Yes Historical Provider, MD  metFORMIN (GLUCOPHAGE-XR) 500 MG 24 hr tablet Take 1 tablet (500 mg total) by mouth daily with breakfast. 01/02/15  Yes Renato Shin, MD  Multiple Vitamin (MULTIVITAMIN WITH MINERALS) TABS tablet Take 1 tablet by mouth daily.   Yes Historical  Provider, MD  omega-3 acid ethyl esters (LOVAZA) 1 G capsule Take 1 g by mouth daily.    Yes Historical Provider, MD  OVER THE COUNTER MEDICATION Take 1 tablet by mouth daily.   Yes Historical Provider, MD  valsartan-hydrochlorothiazide (DIOVAN-HCT) 80-12.5 MG per tablet Take 1 tablet by mouth every morning. May fill generic. 01/27/15  Yes Maryann Mikhail, DO  nitroGLYCERIN (NITROSTAT) 0.4 MG SL tablet Place 1 tablet (0.4 mg total) under the tongue every 5 (five) minutes as needed for chest pain. 01/27/15   Maryann Mikhail, DO   BP 142/89 mmHg  Pulse 84  Temp(Src) 97.3 F (36.3 C) (Oral)  Resp 14  Ht 5\' 5"  (1.651 m)  Wt 211 lb (95.709 kg)  BMI 35.11 kg/m2  SpO2 100% Physical Exam  Constitutional: She is oriented to person, place, and time. She appears well-developed and well-nourished.  HENT:  Head: Normocephalic and atraumatic.  Right Ear: External ear normal.  Left Ear: External ear normal.  Nose: Nose normal.  Eyes: Conjunctivae and EOM are normal. Pupils are equal, round, and reactive to light. Right eye exhibits no discharge. Left eye exhibits no discharge.  Normal visual fields  Cardiovascular: Normal rate, regular rhythm and normal heart sounds.   Pulmonary/Chest: Effort normal and breath sounds normal.  Abdominal: Soft. Tenderness: mild. There is no rigidity and no guarding.  Neurological: She is alert and oriented to person, place, and time. GCS eye subscore is 4. GCS verbal subscore is 5. GCS motor subscore is 6.  CN 2-12 grossly intact. 5/5 strength in all 4 extremities. Normal finger to nose. Normal gross sensation.  Skin: Skin is warm and dry.  Nursing note and vitals reviewed.   ED Course  Procedures (including critical care time) Labs Review Labs Reviewed  BASIC METABOLIC PANEL - Abnormal; Notable for the following:    Glucose, Bld 124 (*)    BUN 29 (*)    Creatinine, Ser 2.40 (*)    GFR calc non Af Amer 20 (*)    GFR calc Af Amer 23 (*)    All other components  within normal limits  CBC WITH DIFFERENTIAL/PLATELET - Abnormal; Notable for the following:    Platelets 435 (*)    All other components within normal limits  URINALYSIS, ROUTINE W REFLEX MICROSCOPIC (NOT AT [facility])    Imaging Review Ct Head Wo Contrast  08/08/2015  CLINICAL DATA:  Left-sided headache.  Hypertension. EXAM: CT HEAD WITHOUT CONTRAST TECHNIQUE: Contiguous axial images were obtained from the base of the skull through the vertex without intravenous contrast. COMPARISON:  CT head 08/02/2011 FINDINGS: Ventricle size is normal. Negative for acute or chronic infarction. Negative for hemorrhage or fluid collection. Negative for mass or edema. No shift of the midline structures. Calvarium is intact. IMPRESSION: Negative Electronically Signed   By: Franchot Gallo M.D.   On: 08/08/2015 14:32   I have personally reviewed  and evaluated these images and lab results as part of my medical decision-making.   EKG Interpretation   Date/Time:  Friday August 08 2015 14:15:15 EST Ventricular Rate:  76 PR Interval:  141 QRS Duration: 84 QT Interval:  366 QTC Calculation: 411 R Axis:   68 Text Interpretation:  Sinus rhythm Normal ECG No significant change since  last tracing Confirmed by Teofilo Lupinacci  MD, Sharian Delia ([id number]) on 08/08/2015 2:56:45  PM      MDM   Final diagnoses:  Acute kidney injury ([place])  Hypertensive urgency    Patient symptoms are consistent with a hypertensive urgency. Upon arrival to the ER room her symptoms are now gone. There was never actual vision loss, more like spots in her vision. Neuro exam now is normal. Likely related to her blood pressure tending down with her oral medicines at home. However, she does have evidence of acute kidney injury. Etiology is unclear. She has not been vomiting or having diarrhea at home. Given IV fluids, will send a urine, and will admit to the hospitalist.    Sherwood Gambler, MD 08/08/15 1553

## 2015-08-08 NOTE — ED Notes (Signed)
Dr. Regenia Skeeter in triage with pt.

## 2015-08-08 NOTE — H&P (Signed)
History and Physical  [name] [id number] DOB: October 29, 1950 DOA: 08/08/2015  Referring physician: Sherwood Caldwell, ER physician  PCP: Samantha Blackbird, MD   Chief Complaint: Blurry vision and dizziness   HPI: [name] is a 64 y.o. female  With past medical history of essential hypertension and diabetes mellitus who was recently started on by mouth Cipro and Flagyl after she had a mild flareup of diverticulitis. Patient states otherwise she's been in her usual state of health, but then started feeling very dizzy and had some episodes of blurry vision so she came into the emergency room. So, patient found to have markedly low blood pressures with systolic greater than [id number]. CT scan of the head was unremarkable. An IV medication, this quickly resolved and the patient's symptoms passed. However, lab work noted BUN 29/creatinine 2.4 with a GFR 23. Rest of her lab work was unremarkable. Patient 6 months ago had completely normal renal function and based on discussion with the patient, she's never had any kidney problems before. A urinalysis was ordered which noted small amount hemoglobin, but no red blood cells. Rest of labs were unrevealing. Hospitalists were called for further evaluation and admission   Review of Systems:  Patient seen after arrival to floor . Pt complains of feeling tired. Her dizziness and revision have since resolved once her blood pressure normalized. .  Pt denies any headache, dysphagia, chest pain, palpitations, short of breath, wheeze, cough, abdominal pain, hematuria, dysuria, constipation, diarrhea, focal extremity numbness weakness or pain, nausea or vomiting .  Review of systems are otherwise negative  Past Medical History  Diagnosis Date  . Hypertension   . High cholesterol     diet controlled - no meds  . SVD (spontaneous vaginal delivery)     x 2  . GERD (gastroesophageal reflux disease)     occas.diet contolled - no meds  . Fibroids   . Headache    "weekly; related to BP" (08/20/2014)  . Arthritis     "knees" (08/20/2014)  . Situational anxiety 11-29-2012    "son passed away"  . Dry eyes     recent treatment  . Diverticulitis    Past Surgical History  Procedure Laterality Date  . Colonoscopy      x3 polyps removed  . Cholecystectomy  1970  . Eus N/A 09/25/2014    Procedure: ESOPHAGEAL ENDOSCOPIC ULTRASOUND (EUS) RADIAL;  Surgeon: Samantha Silence, MD;  Location: [facility];  Service: Endoscopy;  Laterality: N/A;  . Fine needle aspiration N/A 09/25/2014    Procedure: FINE NEEDLE ASPIRATION (FNA) LINEAR;  Surgeon: Samantha Silence, MD;  Location: [facility];  Service: Endoscopy;  Laterality: N/A;  = or- fna   Social History:  reports that she quit smoking about 30 years ago. Her smoking use included Cigarettes. She has a 5 pack-year smoking history. She has never used smokeless tobacco. She reports that she does not drink alcohol or use illicit drugs. Patient lives at home with her brother  & is able to participate in activities of daily living without assistance  No Known Allergies  Family History  Problem Relation Age of Onset  . Diabetes Maternal Grandmother       Prior to Admission medications   Medication Sig Start Date End Date Taking? Authorizing Provider  acetaminophen (TYLENOL) 500 MG tablet Take 500 mg by mouth every 8 (eight) hours as needed for mild pain.   Yes Historical Provider, MD  amLODipine (NORVASC) 5 MG tablet Take 1 tablet (5 mg total) by  mouth daily. 01/27/15  Yes Maryann Mikhail, DO  GARLIC PO Take 1 capsule by mouth daily.   Yes Historical Provider, MD  metFORMIN (GLUCOPHAGE-XR) 500 MG 24 hr tablet Take 1 tablet (500 mg total) by mouth daily with breakfast. 01/02/15  Yes Renato Shin, MD  Multiple Vitamin (MULTIVITAMIN WITH MINERALS) TABS tablet Take 1 tablet by mouth daily.   Yes Historical Provider, MD  omega-3 acid ethyl esters (LOVAZA) 1 G capsule Take 1 g by mouth daily.    Yes Historical Provider, MD  OVER  THE COUNTER MEDICATION Take 1 tablet by mouth daily.   Yes Historical Provider, MD  valsartan-hydrochlorothiazide (DIOVAN-HCT) 80-12.5 MG per tablet Take 1 tablet by mouth every morning. May fill generic. 01/27/15  Yes Maryann Mikhail, DO  nitroGLYCERIN (NITROSTAT) 0.4 MG SL tablet Place 1 tablet (0.4 mg total) under the tongue every 5 (five) minutes as needed for chest pain. 01/27/15   Cristal Ford, DO    Physical Exam: BP 159/87 mmHg  Pulse 85  Temp(Src) 98.4 F (36.9 C) (Oral)  Resp 16  Ht 5\' 5"  (1.651 m)  Wt 95.709 kg (211 lb)  BMI 35.11 kg/m2  SpO2 100%  General:  Alert and oriented 3, no acute distress  Eyes: Sclera nonicteric, asked ocular movements are intact  ENT: Normocephalic atraumatic, mucous members are moist  Neck: No JVD  Cardiovascular: Regular rate and rhythm, S1-S2  Respiratory: Clear to auscultation bilaterally  Abdomen: Soft, nontender, nondistended, positive bowel sounds  Skin: No skin breaks, tears or lesions  Musculoskeletal: No clubbing or cyanosis or edema  Psychiatric: Patient is appropriate, no evidence of psychoses  Neurologic: No focal deficits           Labs on Admission:  Basic Metabolic Panel:  Recent Labs Lab 08/08/15 1310  NA 141  K 4.1  CL 104  CO2 28  GLUCOSE 124*  BUN 29*  CREATININE 2.40*  CALCIUM 9.3   Liver Function Tests: No results for input(s): AST, ALT, ALKPHOS, BILITOT, PROT, ALBUMIN in the last 168 hours. No results for input(s): LIPASE, AMYLASE in the last 168 hours. No results for input(s): AMMONIA in the last 168 hours. CBC:  Recent Labs Lab 08/08/15 1310  WBC 7.6  NEUTROABS 5.2  HGB 13.0  HCT 39.5  MCV 86.2  PLT 435*   Cardiac Enzymes: No results for input(s): CKTOTAL, CKMB, CKMBINDEX, TROPONINI in the last 168 hours.  BNP (last 3 results) No results for input(s): BNP in the last 8760 hours.  ProBNP (last 3 results) No results for input(s): PROBNP in the last 8760 hours.  CBG:  Recent Labs Lab  08/08/15 1717  GLUCAP 125*    Radiological Exams on Admission: Ct Head Wo Contrast  08/08/2015  CLINICAL DATA:  Left-sided headache.  Hypertension. EXAM: CT HEAD WITHOUT CONTRAST TECHNIQUE: Contiguous axial images were obtained from the base of the skull through the vertex without intravenous contrast. COMPARISON:  CT head 08/02/2011 FINDINGS: Ventricle size is normal. Negative for acute or chronic infarction. Negative for hemorrhage or fluid collection. Negative for mass or edema. No shift of the midline structures. Calvarium is intact. IMPRESSION: Negative Electronically Signed   By: Franchot Gallo M.D.   On: 08/08/2015 14:32    EKG: Independently reviewed. Normal sinus rhythm without evidence of left ventricular hypertrophy   Assessment/Plan Present on Admission:  . Acute kidney injury [facility]): I suspect that it is a combination of dehydration/prerenal from diverticulitis plus worsened by her ACE inhibitor/diuretic plus the full strength  Cipro. Interestingly, urine notes large amount of hemoglobin, but no red blood cells. She does not note any symptoms of rhabdomyolysis and has had no recent injuries or prolonged states of immobility. CPK level pending.   Diabetes mellitus: Well-controlled. Sliding scale only, holding metformin  . Essential hypertension with hypertensive urgency: Resolved. Suspect brief flare may have been due to renal dysfunction? Resuming Norvasc and holding ACE inhibitor/diuretic. When necessary hydralazine. Do not think that she has chronically high blood pressure given good compliance, good follow-up with her PCP and no LVH on EKG. Nevertheless we'll check BNP  . Obesity (BMI 30-39.9): Patient meets criteria with BMI greater than 30   . Sigmoid diverticulitis: Stable. Continue by mouth Cipro and Flagyl, Cipro and adjusted for renal dosing, 250 daily    Consultants: None   Code Status: Full code  Family Communication: Left message with brother    Disposition Plan:  Potential home tomorrow for no function resolved   Time spent: 40 minutes   [facility] Pager [phone]

## 2015-08-09 DIAGNOSIS — E669 Obesity, unspecified: Secondary | ICD-10-CM | POA: Diagnosis not present

## 2015-08-09 DIAGNOSIS — I10 Essential (primary) hypertension: Secondary | ICD-10-CM | POA: Diagnosis not present

## 2015-08-09 DIAGNOSIS — N179 Acute kidney failure, unspecified: Secondary | ICD-10-CM | POA: Diagnosis not present

## 2015-08-09 DIAGNOSIS — K5732 Diverticulitis of large intestine without perforation or abscess without bleeding: Secondary | ICD-10-CM | POA: Diagnosis not present

## 2015-08-09 LAB — BASIC METABOLIC PANEL
Anion gap: 6 (ref 5–15)
BUN: 23 mg/dL — AB (ref 6–20)
CHLORIDE: 106 mmol/L (ref 101–111)
CO2: 29 mmol/L (ref 22–32)
CREATININE: 2.01 mg/dL — AB (ref 0.44–1.00)
Calcium: 8.8 mg/dL — ABNORMAL LOW (ref 8.9–10.3)
GFR calc non Af Amer: 25 mL/min — ABNORMAL LOW (ref >60)
GFR, EST AFRICAN AMERICAN: 29 mL/min — AB (ref >60)
Glucose, Bld: 118 mg/dL — ABNORMAL HIGH (ref 65–99)
POTASSIUM: 4.1 mmol/L (ref 3.5–5.1)
Sodium: 141 mmol/L (ref 135–145)

## 2015-08-09 LAB — GLUCOSE, CAPILLARY
GLUCOSE-CAPILLARY: 121 mg/dL — AB (ref 65–99)
GLUCOSE-CAPILLARY: 121 mg/dL — AB (ref 65–99)
Glucose-Capillary: 123 mg/dL — ABNORMAL HIGH (ref 65–99)
Glucose-Capillary: 80 mg/dL (ref 65–99)

## 2015-08-09 MED ORDER — ALUM & MAG HYDROXIDE-SIMETH 200-200-20 MG/5ML PO SUSP
30.0000 mL | Freq: Four times a day (QID) | ORAL | Status: DC | PRN
  Administered 2015-08-09: 30 mL via ORAL
  Filled 2015-08-09: qty 30

## 2015-08-09 MED ORDER — INFLUENZA VAC SPLIT QUAD 0.5 ML IM SUSY: 0.5000 mL | PREFILLED_SYRINGE | INTRAMUSCULAR | Status: DC

## 2015-08-09 MED ORDER — CLONIDINE HCL 0.1 MG PO TABS
0.1000 mg | ORAL_TABLET | Freq: Two times a day (BID) | ORAL | Status: DC
  Administered 2015-08-09 – 2015-08-10 (×3): 0.1 mg via ORAL
  Filled 2015-08-09 (×3): qty 1

## 2015-08-09 NOTE — Progress Notes (Signed)
PROGRESS NOTE  [name] [id number] DOB: 08/31/1950 DOA: 08/08/2015 PCP: Antony Blackbird, MD  HPI/Recap of past 73 hours: 64 year old female with past history of hypertension and recent diverticulitis flare being treated as outpatient on by mouth Cipro and Flagyl presented to emergency room on 12/16 with complaints of dizziness and blurry vision and found to have hypertensive urgency which quickly responded to IV medications. Patient 1 with her medicines, however lab work noted for elevated creatinine of 2.6 when previous renal function normal a few months ago. Patient admitted and started on IV fluids  Today, creatinine down to 2. Patient feeling somewhat better. Patient has had elevations in blood pressure, but not as high as admission  Assessment/Plan: Active Problems:   Sigmoid diverticulitis: Continue Cipro and Flagyl, renally adjust Cipro   Essential hypertension with hypertensive urgency: Blood pressure trending upward. Started by mouth clonidine plus continue when necessary IV hydralazine   Diabetes ([place]): Stable, on sliding scale only     Obesity (BMI 30-39.9): Patient meets criteria with BMI greater than 30    AKI (acute kidney injury) [facility]): Principal problem. Suspect secondary to dehydration made worse by full dose Cipro and ACE inhibitor/diuretic. Antihypertensives on hold. Continue Cipro on renally adjusted dose. Continue IV fluids as mainstay treatment   Code Status: Full code   Family Communication: Left message with brother   Disposition Plan: Anticipate home tomorrow    Consultants:  None   Procedures:  None   Antibiotics:  Cipro continued from home, renally adjusted 12/12-present  Flagyl continued from home 12/12-present   Objective: BP 162/75 mmHg  Pulse 70  Temp(Src) 97.5 F (36.4 C) (Oral)  Resp 18  Ht 5\' 5"  (1.651 m)  Wt 95.709 kg (211 lb)  BMI 35.11 kg/m2  SpO2 99%  Intake/Output Summary (Last 24 hours) at 08/09/15 1558 Last data  filed at 08/09/15 1310  Gross per 24 hour  Intake   2405 ml  Output   1000 ml  Net   1405 ml   Filed Weights   08/08/15 1222  Weight: 95.709 kg (211 lb)    Exam:   General:  Alert and oriented 3   Cardiovascular: Regular rate and rhythm, S1 and S2   Respiratory: Clear to auscultation bilaterally   Abdomen: Soft, nontender, nondistended, positive bowel sounds   Musculoskeletal: No clubbing or cyanosis or edema    Data Reviewed: Basic Metabolic Panel:  Recent Labs Lab 08/08/15 1310 08/09/15 0347  NA 141 141  K 4.1 4.1  CL 104 106  CO2 28 29  GLUCOSE 124* 118*  BUN 29* 23*  CREATININE 2.40* 2.01*  CALCIUM 9.3 8.8*   Liver Function Tests: No results for input(s): AST, ALT, ALKPHOS, BILITOT, PROT, ALBUMIN in the last 168 hours. No results for input(s): LIPASE, AMYLASE in the last 168 hours. No results for input(s): AMMONIA in the last 168 hours. CBC:  Recent Labs Lab 08/08/15 1310  WBC 7.6  NEUTROABS 5.2  HGB 13.0  HCT 39.5  MCV 86.2  PLT 435*   Cardiac Enzymes:    Recent Labs Lab 08/08/15 1904  CKTOTAL 93   BNP (last 3 results)  Recent Labs  08/08/15 1904  BNP 81.6    ProBNP (last 3 results) No results for input(s): PROBNP in the last 8760 hours.  CBG:  Recent Labs Lab 08/08/15 1717 08/08/15 2147 08/09/15 0730 08/09/15 1232  GLUCAP 125* 111* 123* 121*    No results found for this or any previous visit (from the  past 240 hour(s)).   Studies: No results found.  Scheduled Meds: . amLODipine  5 mg Oral Daily  . ciprofloxacin  250 mg Oral Daily  . cloNIDine  0.1 mg Oral BID  . enoxaparin (LOVENOX) injection  30 mg Subcutaneous Q24H  . [START ON 08/10/2015] Influenza vac split quadrivalent PF  0.5 mL Intramuscular Tomorrow-1000  . insulin aspart  0-5 Units Subcutaneous QHS  . insulin aspart  0-9 Units Subcutaneous TID WC  . metroNIDAZOLE  500 mg Oral 3 times per day    Continuous Infusions: . sodium chloride 125 mL/hr at  08/09/15 1310     Time spent: 15 minutes  [facility] Pager [phone] . If 7PM-7AM, please contact night-coverage at [email or web address], password [facility] 08/09/2015, 3:58 PM

## 2015-08-10 DIAGNOSIS — I160 Hypertensive urgency: Secondary | ICD-10-CM

## 2015-08-10 DIAGNOSIS — K5732 Diverticulitis of large intestine without perforation or abscess without bleeding: Secondary | ICD-10-CM | POA: Diagnosis not present

## 2015-08-10 DIAGNOSIS — I10 Essential (primary) hypertension: Secondary | ICD-10-CM | POA: Diagnosis not present

## 2015-08-10 DIAGNOSIS — N179 Acute kidney failure, unspecified: Secondary | ICD-10-CM | POA: Diagnosis not present

## 2015-08-10 LAB — BASIC METABOLIC PANEL
Anion gap: 6 (ref 5–15)
Anion gap: 6 (ref 5–15)
BUN: 21 mg/dL — AB (ref 6–20)
BUN: 15 mg/dL (ref 6–20)
CALCIUM: 8.8 mg/dL — AB (ref 8.9–10.3)
CHLORIDE: 108 mmol/L (ref 101–111)
CHLORIDE: 107 mmol/L (ref 101–111)
CO2: 27 mmol/L (ref 22–32)
CO2: 26 mmol/L (ref 22–32)
CREATININE: 1.47 mg/dL — AB (ref 0.44–1.00)
CREATININE: 1.21 mg/dL — AB (ref 0.44–1.00)
Calcium: 9.1 mg/dL (ref 8.9–10.3)
GFR calc Af Amer: 42 mL/min — ABNORMAL LOW (ref >60)
GFR calc Af Amer: 54 mL/min — ABNORMAL LOW (ref >60)
GFR calc non Af Amer: 37 mL/min — ABNORMAL LOW (ref >60)
GFR calc non Af Amer: 46 mL/min — ABNORMAL LOW (ref >60)
GLUCOSE: 107 mg/dL — AB (ref 65–99)
Glucose, Bld: 106 mg/dL — ABNORMAL HIGH (ref 65–99)
POTASSIUM: 4.3 mmol/L (ref 3.5–5.1)
Potassium: 4.2 mmol/L (ref 3.5–5.1)
Sodium: 141 mmol/L (ref 135–145)
Sodium: 139 mmol/L (ref 135–145)

## 2015-08-10 LAB — GLUCOSE, CAPILLARY
Glucose-Capillary: 94 mg/dL (ref 65–99)
Glucose-Capillary: 90 mg/dL (ref 65–99)
Glucose-Capillary: 94 mg/dL (ref 65–99)

## 2015-08-10 MED ORDER — ENOXAPARIN SODIUM 40 MG/0.4ML ~~LOC~~ SOLN: 40.0000 mg | SUBCUTANEOUS | Status: DC

## 2015-08-10 MED ORDER — ACETAMINOPHEN 325 MG PO TABS
650.0000 mg | ORAL_TABLET | ORAL | Status: AC
  Administered 2015-08-10: 650 mg via ORAL
  Filled 2015-08-10: qty 2

## 2015-08-10 MED ORDER — ALPRAZOLAM 0.5 MG PO TABS
0.5000 mg | ORAL_TABLET | Freq: Once | ORAL | Status: AC
  Administered 2015-08-10: 0.5 mg via ORAL
  Filled 2015-08-10: qty 1

## 2015-08-10 NOTE — Discharge Summary (Signed)
Discharge Summary  [name] [id number] DOB: 11/10/1950  PCP: Antony Blackbird, MD  Admit date: 08/08/2015 Discharge date: 08/10/2015  Time spent: 25 minutes   Recommendations for Outpatient Follow-up:  1. Patient will follow-up with her PCP in the next 2 weeks & at that time a repat BMET should be done  2. She will continue Cipro & Flagyl at her normal doses until Thurs 12/22 3. She will hold her HCTZ/ARB until she sees her PCP 4. She will double her Norvasc in the interim while holding her other BP medicine  Discharge Diagnoses:  Active Hospital Problems   Diagnosis Date Noted  . Acute kidney injury ([place]) 08/08/2015  . Obesity (BMI 30-39.9) 08/08/2015  . AKI (acute kidney injury) ([place]) 08/08/2015  . Hypertensive urgency 01/27/2015  . Diabetes ([place]) 01/02/2015  . Essential hypertension 08/20/2014  . Sigmoid diverticulitis 08/20/2014    Resolved Hospital Problems   Diagnosis Date Noted Date Resolved  No resolved problems to display.    Discharge Condition: Improved, being discharged home   Diet recommendation: Low-sodium   Filed Weights   08/08/15 1222  Weight: 95.709 kg (211 lb)    History of present illness:  64 year old female with past history of hypertension and recent diverticulitis flare being treated as outpatient on by mouth Cipro and Flagyl presented to emergency room on 12/16 with complaints of dizziness and blurry vision and found to have hypertensive urgency which quickly responded to IV medications. Patient 1 with her medicines, however lab work noted for elevated creatinine of 2.6 when previous renal function normal a few months ago. Patient admitted and started on IV fluids   Hospital Course:  Active Problems:   Sigmoid diverticulitis: Patient was continued on her Cipro and Flagyl with the Cipro renally adjusted. Upon discharge with renal function normalized, she will resume Cipro at regular dose plus Flagyl until Thursday 12/22    Essential  hypertension with hypertensive urgency: Patient was continued on her Norvasc. Her ACE inhibitor/diuretic was put on hold. Blood pressures trended upward and so she was put on by mouth clonidine plus when necessary hydralazine during her hospitalization.    Diabetes ([place]): Stable, continue metformin    Acute kidney injury [facility]): Principal problem. Suspect the cause of secondary to dehydration initially from diverticulitis plus combination ACE inhibitor/diuretic while also on full-strength Cipro. With IV fluids, by 12/17, creatinine down to 2 and by the following morning, down to 1.47. Patient was continued on IV fluids and by that afternoon, 12 hours later, her creatinine was 1.21.  She will be discharged home, but she will hold her ARB/HCTZ until she sees her PCP within 2 weeks & a repeat BMET is checked.  She will double her Norvasc in the interim.    Obesity (BMI 30-39.9): Patient meets criteria with BMI greater than 30.    Procedures:  None   Consultations:  None   Discharge Exam: BP 141/81 mmHg  Pulse 75  Temp(Src) 98.1 F (36.7 C) (Oral)  Resp 18  Ht 5\' 5"  (1.651 m)  Wt 95.709 kg (211 lb)  BMI 35.11 kg/m2  SpO2 99%  General: Alert and oriented 3, no acute distress  Cardiovascular: Regular rate and rhythm, S1-S2  Respiratory: Clear to auscultation bilaterally   Discharge Instructions You were cared for by a hospitalist during your hospital stay. If you have any questions about your discharge medications or the care you received while you were in the hospital after you are discharged, you can call the unit and asked  to speak with the hospitalist on call if the hospitalist that took care of you is not available. Once you are discharged, your primary care physician will handle any further medical issues. Please note that NO REFILLS for any discharge medications will be authorized once you are discharged, as it is imperative that you return to your primary care physician (or  establish a relationship with a primary care physician if you do not have one) for your aftercare needs so that they can reassess your need for medications and monitor your lab values.     Medication List    STOP taking these medications        OVER THE COUNTER MEDICATION      TAKE these medications        acetaminophen 500 MG tablet  Commonly known as:  TYLENOL  Take 500 mg by mouth every 8 (eight) hours as needed for mild pain.     amLODipine 5 MG tablet  Commonly known as:  NORVASC  Take 1 tablet (5 mg total) by mouth daily.     GARLIC PO  Take 1 capsule by mouth daily.     metFORMIN 500 MG 24 hr tablet  Commonly known as:  GLUCOPHAGE-XR  Take 1 tablet (500 mg total) by mouth daily with breakfast.     multivitamin with minerals Tabs tablet  Take 1 tablet by mouth daily.     nitroGLYCERIN 0.4 MG SL tablet  Commonly known as:  NITROSTAT  Place 1 tablet (0.4 mg total) under the tongue every 5 (five) minutes as needed for chest pain.     omega-3 acid ethyl esters 1 G capsule  Commonly known as:  LOVAZA  Take 1 g by mouth daily.     valsartan-hydrochlorothiazide 80-12.5 MG tablet  Commonly known as:  DIOVAN-HCT  Take 1 tablet by mouth every morning. May fill generic.       No Known Allergies     Follow-up Information    Schedule an appointment as soon as possible for a visit with FULP, CAMMIE, MD.   Specialty:  Family Medicine   Why:  This week or next.   Contact information:   [id number] [place] [address] [phone]        The results of significant diagnostics from this hospitalization (including imaging, microbiology, ancillary and laboratory) are listed below for reference.    Significant Diagnostic Studies: Ct Head Wo Contrast  08/08/2015  CLINICAL DATA:  Left-sided headache.  Hypertension. EXAM: CT HEAD WITHOUT CONTRAST TECHNIQUE: Contiguous axial images were obtained from the base of the skull through the vertex without intravenous  contrast. COMPARISON:  CT head 08/02/2011 FINDINGS: Ventricle size is normal. Negative for acute or chronic infarction. Negative for hemorrhage or fluid collection. Negative for mass or edema. No shift of the midline structures. Calvarium is intact. IMPRESSION: Negative Electronically Signed   By: Franchot Gallo M.D.   On: 08/08/2015 14:32    Microbiology: No results found for this or any previous visit (from the past 240 hour(s)).   Labs: Basic Metabolic Panel:  Recent Labs Lab 08/08/15 1310 08/09/15 0347 08/10/15 0431  NA 141 141 141  K 4.1 4.1 4.2  CL 104 106 108  CO2 28 29 27   GLUCOSE 124* 118* 106*  BUN 29* 23* 21*  CREATININE 2.40* 2.01* 1.47*  CALCIUM 9.3 8.8* 8.8*   Liver Function Tests: No results for input(s): AST, ALT, ALKPHOS, BILITOT, PROT, ALBUMIN in the last 168 hours. No results  for input(s): LIPASE, AMYLASE in the last 168 hours. No results for input(s): AMMONIA in the last 168 hours. CBC:  Recent Labs Lab 08/08/15 1310  WBC 7.6  NEUTROABS 5.2  HGB 13.0  HCT 39.5  MCV 86.2  PLT 435*   Cardiac Enzymes:  Recent Labs Lab 08/08/15 1904  CKTOTAL 93   BNP: BNP (last 3 results)  Recent Labs  08/08/15 1904  BNP 81.6    ProBNP (last 3 results) No results for input(s): PROBNP in the last 8760 hours.  CBG:  Recent Labs Lab 08/09/15 1232 08/09/15 1725 08/09/15 2049 08/10/15 0742 08/10/15 1232  GLUCAP 121* 80 121* 94 90       Signed:  Brenlyn Beshara K  [facility] 08/10/2015, 4:27 PM

## 2015-08-10 NOTE — Progress Notes (Signed)
Pt ready for discharge.  AVS reviewed with pt and copy given.

## 2015-11-03 ENCOUNTER — Ambulatory Visit: Payer: BLUE CROSS/BLUE SHIELD | Admitting: Skilled Nursing Facility1

## 2016-10-09 IMAGING — MR MR ABDOMEN WO/W CM MRCP
12 of 22 series · 20 of 48 positions shown · IV contrast (multihance)
Comparison: CT on 08/19/2014

CLINICAL DATA: Pancreatic mass seen on recent CT. Left lower
quadrant pain.

EXAM:
MRI ABDOMEN WITHOUT AND WITH CONTRAST (INCLUDING MRCP)
TECHNIQUE: Multiplanar multisequence MR imaging of the abdomen was performed
both before and after the administration of intravenous contrast.
Heavily T2-weighted images of the biliary and pancreatic ducts were
obtained, and three-dimensional MRCP images were rendered by post
processing.
CONTRAST:  20mL MULTIHANCE GADOBENATE DIMEGLUMINE 529 MG/ML IV SOLN

[Series 3: cor ssfse nav · coronal · 6.0mm · 0.78mm/px · 1 of 34 slices shown]
[im 1/34]
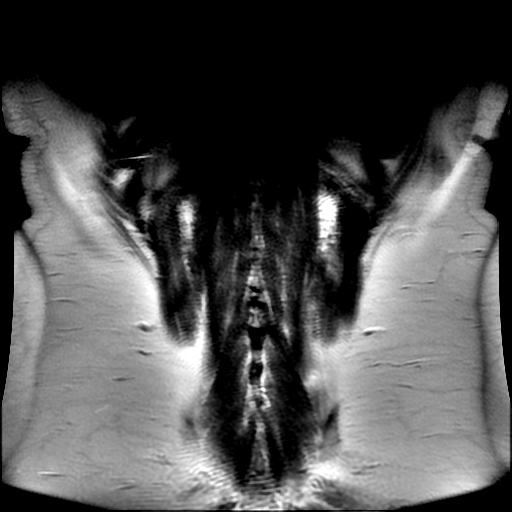

[Series 6: ax ssfse nav · axial · 6.0mm · 0.74mm/px · 1 of 37 slices shown]
[im 1/37]
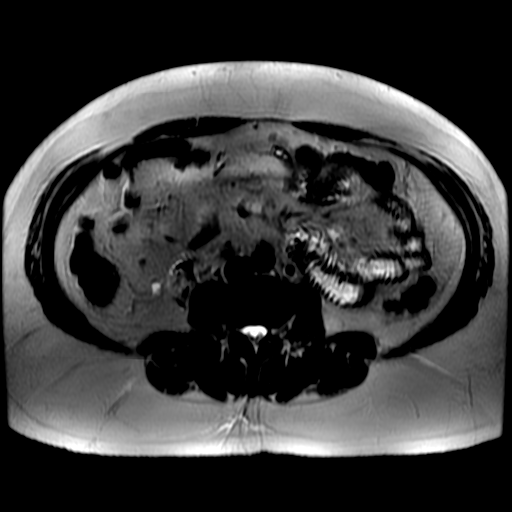

[Series 7: T2 fat-sat · axial · 5.5mm · 0.74mm/px · 1 of 41 slices shown]
[im 1/41]
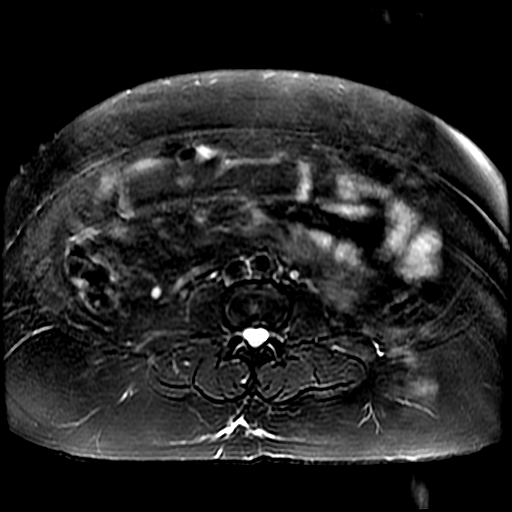

[Series 8: DWI b500 · axial · 6.0mm · 1.56mm/px · 1 of 74 slices shown]
[im 1/74]
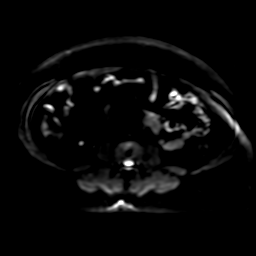

[Series 9: T1 dynamic · axial · 4.6mm · 0.82mm/px · z∈[-50,+205]mm · 2 of 112 slices shown (1 of 3)]
[im 1/112]
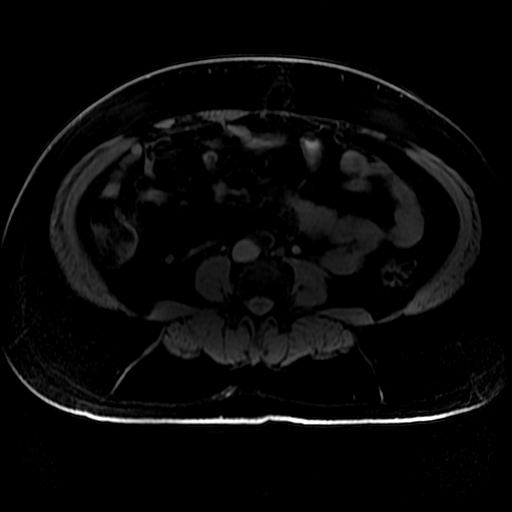
[im 112/112]
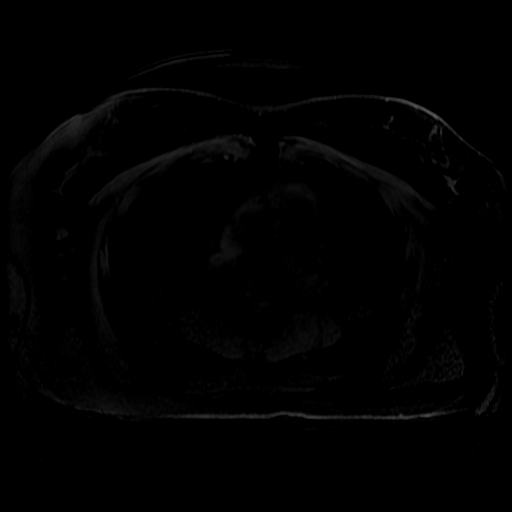

[Series 11: cor ssfse thin · coronal · 2.0mm · 0.74mm/px · 1 of 52 slices shown]
[im 1/52]
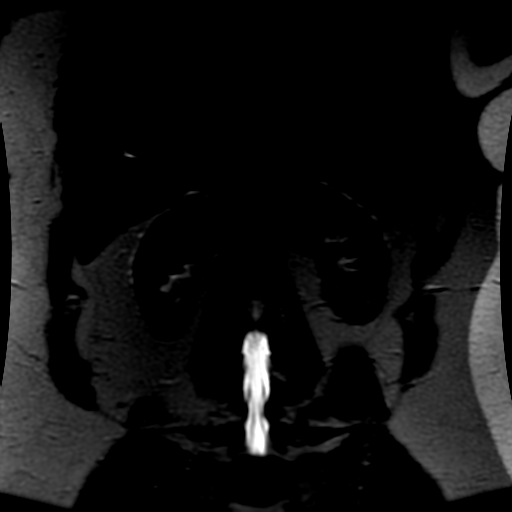

[Series 12: 2d thick slab · coronal · 40.0mm · 0.59mm/px · 1 of 12 slices shown]
[im 1/12]
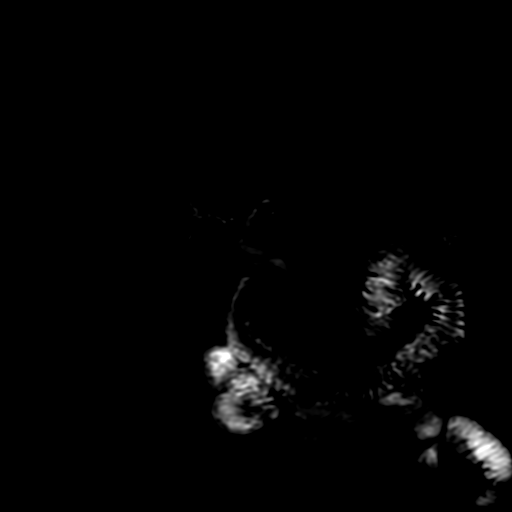

[Series 14: T1 dynamic post-contrast · coronal · 3.8mm · 0.82mm/px · 3 of 120 slices shown (1 of 2)]
[im 1/120]
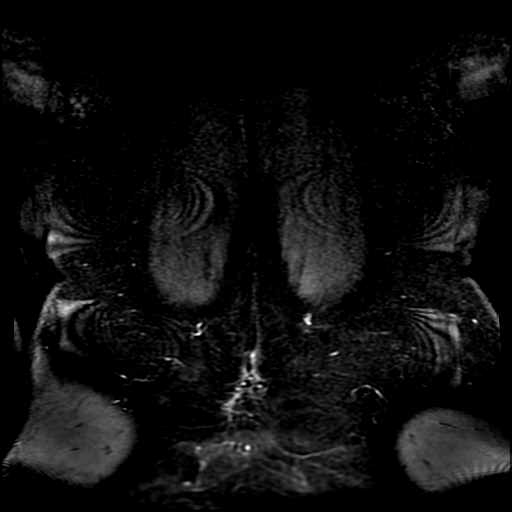
[im 60/120]
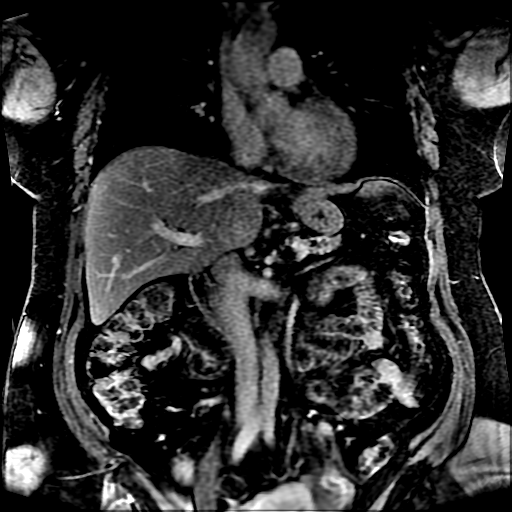
[im 120/120]
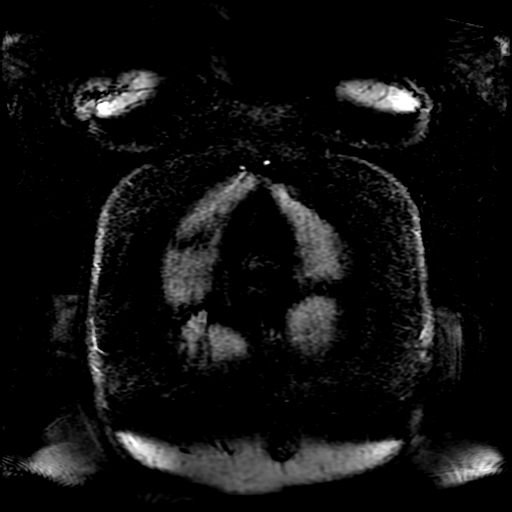

[Series 800: DWI · axial · 6.0mm · 1.56mm/px · 1 of 36 slices shown]
[im 1/36]
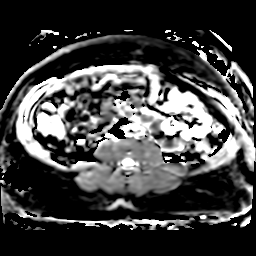

[Series 901: T1 dynamic · axial · 4.6mm · 0.82mm/px · z∈[-50,+205]mm · 3 of 112 slices shown (2 of 3)]
[im 1/112]
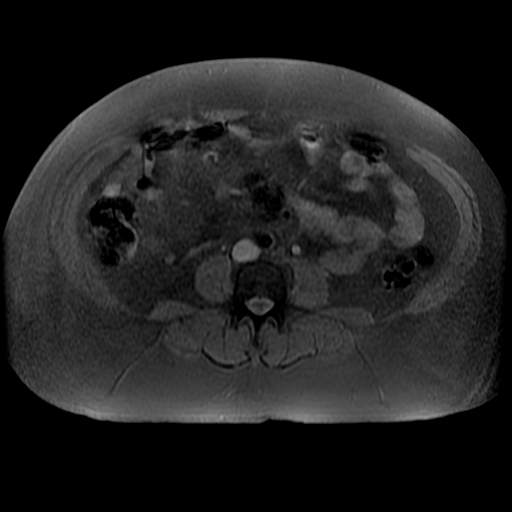
[im 56/112]
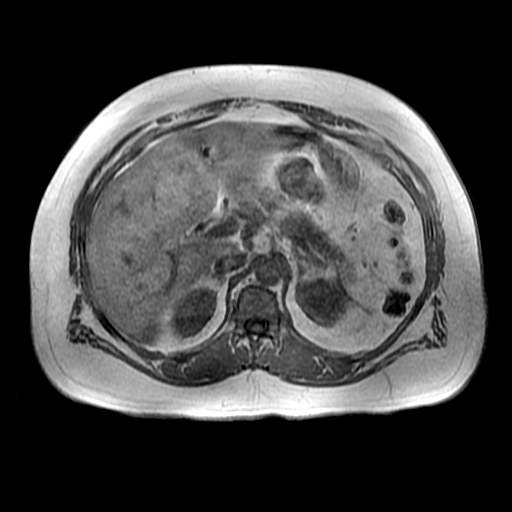
[im 112/112]
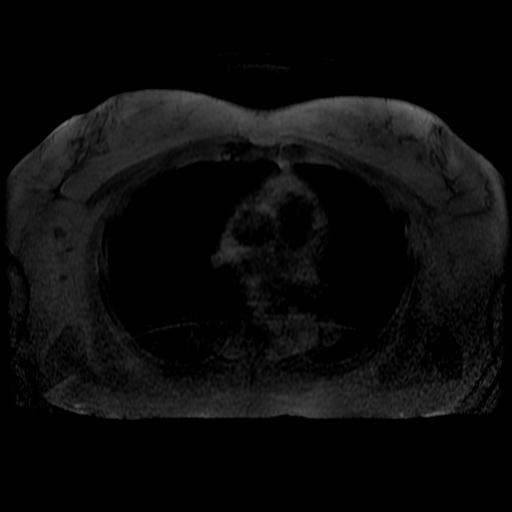

[Series 902: T1 dynamic · axial · 4.6mm · 0.82mm/px · z∈[-50,+205]mm · 3 of 112 slices shown (3 of 3)]
[im 1/112]
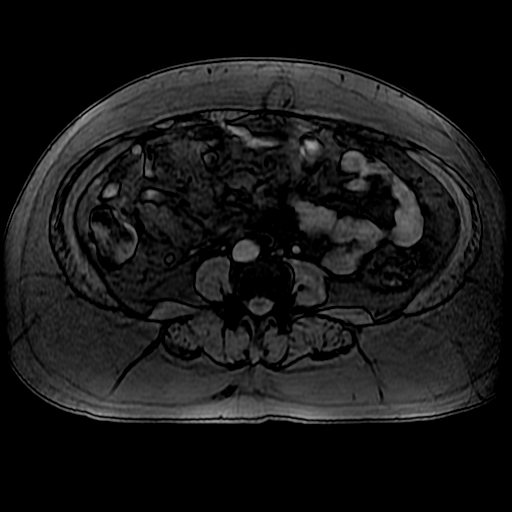
[im 56/112]
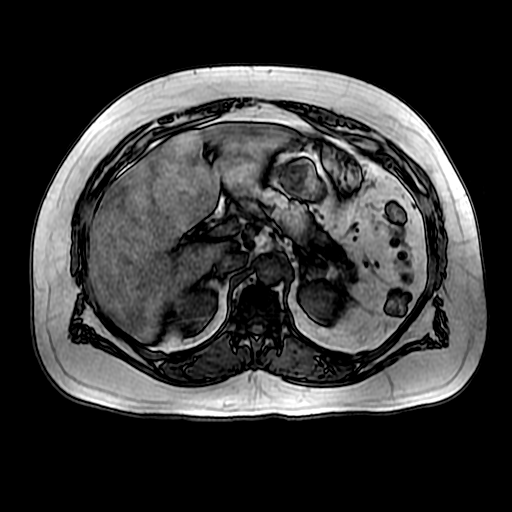
[im 112/112]
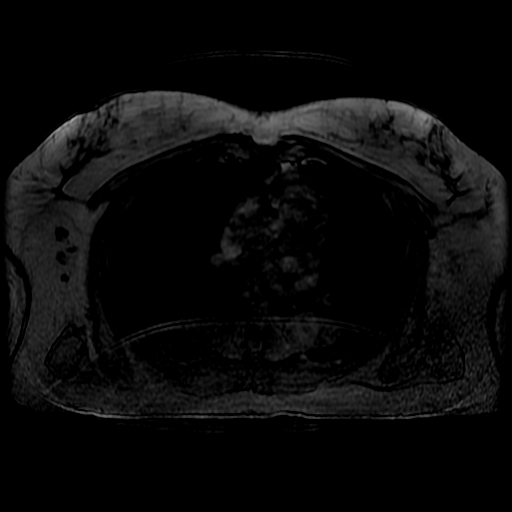

[Series 1300: T1 dynamic post-contrast · axial · non-contrast · 4.4mm · 0.82mm/px · z∈[-44,+77]mm · 2 of 112 slices shown (2 of 2)]
[im 1/112]
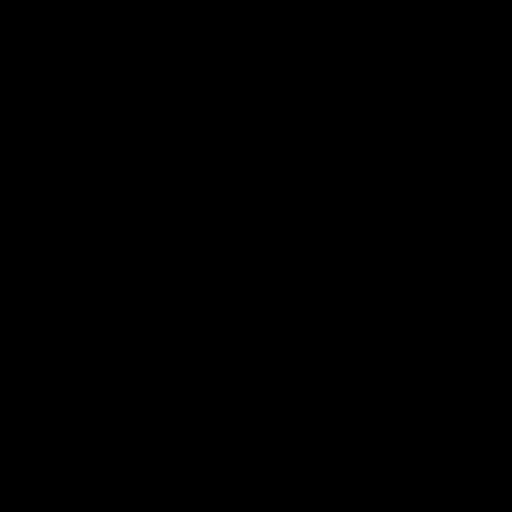
[im 56/112]
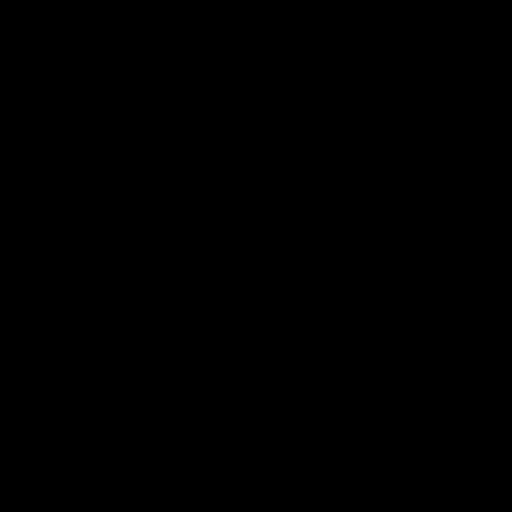

[20 of 48 positions shown; findings below may reference images not displayed]

FINDINGS: Lower chest:  Unremarkable.

Liver: Scattered tiny sub-cm hepatic cysts are noted, however no
liver masses are identified. Mild hepatic steatosis also
demonstrated.

Pancreas: A solid enhancing mass is seen in the pancreatic body
which measures 2.5 x 3.2 cm on image 52 of series 1535. This mass
appears to involve both the proximal splenic artery and vein on
image 53 of series 3943 and 54 of series 1535, but does not involve
the celiac trunk or superior mesenteric artery.

MRCP - Biliary and pancreatic ducts: No evidence of biliary ductal
dilatation or choledocholithiasis. The main pancreatic duct is
dilated in the pancreatic tail, proximal to the pancreatic body
mass.

Spleen:  Within normal limits in size and appearance.

Adrenal Glands:  No masses identified.

Kidneys: No complex cystic or solid masses identified. No evidence
of urolithiasis or hydronephrosis.

Stomach/Bowel/Peritoneum: No evidence of wall thickening, mass, or
obstruction involving visualized abdominal bowel.

Vascular/Lymphatic: Mild peripancreatic lymphadenopathy is seen,
with largest lymph node in the portacaval space measuring 15 mm.

Other:  None.

Musculoskeletal:  No suspicious bone lesions identified.
IMPRESSION: 3.2 cm solid mass in the pancreatic body causing upstream pancreatic
ductal dilatation, highly suspicious for pancreatic carcinoma. This
mass appears to involve the proximal splenic artery and vein.
Consider EUS for tissue diagnosis.

Mild peripancreatic lymphadenopathy, suspicious for metastatic
disease.

No evidence of liver metastases or biliary ductal dilatation.

## 2016-12-28 ENCOUNTER — Other Ambulatory Visit: Payer: Self-pay | Admitting: Family Medicine

## 2016-12-28 DIAGNOSIS — R109 Unspecified abdominal pain: Secondary | ICD-10-CM | POA: Diagnosis not present

## 2016-12-28 DIAGNOSIS — E785 Hyperlipidemia, unspecified: Secondary | ICD-10-CM | POA: Diagnosis not present

## 2016-12-28 DIAGNOSIS — I10 Essential (primary) hypertension: Secondary | ICD-10-CM | POA: Diagnosis not present

## 2016-12-28 DIAGNOSIS — R739 Hyperglycemia, unspecified: Secondary | ICD-10-CM | POA: Diagnosis not present

## 2017-01-04 DIAGNOSIS — R5383 Other fatigue: Secondary | ICD-10-CM | POA: Diagnosis not present

## 2017-01-28 DIAGNOSIS — Z79899 Other long term (current) drug therapy: Secondary | ICD-10-CM | POA: Diagnosis not present

## 2018-01-04 DIAGNOSIS — Z0000 Encounter for general adult medical examination without abnormal findings: Secondary | ICD-10-CM | POA: Diagnosis not present

## 2018-01-04 DIAGNOSIS — E785 Hyperlipidemia, unspecified: Secondary | ICD-10-CM | POA: Diagnosis not present

## 2018-01-04 DIAGNOSIS — E119 Type 2 diabetes mellitus without complications: Secondary | ICD-10-CM | POA: Diagnosis not present

## 2018-01-04 DIAGNOSIS — I10 Essential (primary) hypertension: Secondary | ICD-10-CM | POA: Diagnosis not present

## 2018-01-06 ENCOUNTER — Telehealth: Payer: Self-pay | Admitting: Endocrinology

## 2018-01-06 NOTE — Telephone Encounter (Signed)
I have faxed to [facility] at Dr. Clair Gulling office.

## 2018-01-06 NOTE — Telephone Encounter (Signed)
[name] (Dr. Tommi Rumps) with [facility] medicine in [place] needs the biopsy report of thyroid. Please fax to them at fax# [phone]. If questions call ph# [phone].

## 2018-05-12 DIAGNOSIS — I10 Essential (primary) hypertension: Secondary | ICD-10-CM | POA: Diagnosis not present

## 2018-05-12 DIAGNOSIS — J209 Acute bronchitis, unspecified: Secondary | ICD-10-CM | POA: Diagnosis not present

## 2018-07-12 DIAGNOSIS — L03211 Cellulitis of face: Secondary | ICD-10-CM | POA: Diagnosis not present

## 2018-08-02 DIAGNOSIS — L0201 Cutaneous abscess of face: Secondary | ICD-10-CM | POA: Diagnosis not present

## 2018-09-19 DIAGNOSIS — L03211 Cellulitis of face: Secondary | ICD-10-CM | POA: Diagnosis not present

## 2018-09-19 DIAGNOSIS — L0201 Cutaneous abscess of face: Secondary | ICD-10-CM | POA: Diagnosis not present

## 2018-10-27 DIAGNOSIS — E785 Hyperlipidemia, unspecified: Secondary | ICD-10-CM | POA: Diagnosis not present

## 2018-10-27 DIAGNOSIS — E119 Type 2 diabetes mellitus without complications: Secondary | ICD-10-CM | POA: Diagnosis not present

## 2018-10-27 DIAGNOSIS — I10 Essential (primary) hypertension: Secondary | ICD-10-CM | POA: Diagnosis not present

## 2018-10-27 DIAGNOSIS — K869 Disease of pancreas, unspecified: Secondary | ICD-10-CM | POA: Diagnosis not present

## 2018-11-01 ENCOUNTER — Other Ambulatory Visit: Payer: Self-pay | Admitting: Family Medicine

## 2018-11-01 DIAGNOSIS — K8689 Other specified diseases of pancreas: Secondary | ICD-10-CM

## 2018-11-02 ENCOUNTER — Other Ambulatory Visit: Payer: Self-pay | Admitting: Family Medicine

## 2018-11-02 DIAGNOSIS — E041 Nontoxic single thyroid nodule: Secondary | ICD-10-CM

## 2019-08-21 DIAGNOSIS — I10 Essential (primary) hypertension: Secondary | ICD-10-CM | POA: Diagnosis not present

## 2019-08-21 DIAGNOSIS — E119 Type 2 diabetes mellitus without complications: Secondary | ICD-10-CM | POA: Diagnosis not present

## 2019-08-21 DIAGNOSIS — Z0000 Encounter for general adult medical examination without abnormal findings: Secondary | ICD-10-CM | POA: Diagnosis not present

## 2019-08-21 DIAGNOSIS — K5792 Diverticulitis of intestine, part unspecified, without perforation or abscess without bleeding: Secondary | ICD-10-CM | POA: Diagnosis not present

## 2019-11-19 DIAGNOSIS — I10 Essential (primary) hypertension: Secondary | ICD-10-CM | POA: Diagnosis not present

## 2019-11-19 DIAGNOSIS — E785 Hyperlipidemia, unspecified: Secondary | ICD-10-CM | POA: Diagnosis not present

## 2019-11-19 DIAGNOSIS — Z7984 Long term (current) use of oral hypoglycemic drugs: Secondary | ICD-10-CM | POA: Diagnosis not present

## 2019-11-19 DIAGNOSIS — E119 Type 2 diabetes mellitus without complications: Secondary | ICD-10-CM | POA: Diagnosis not present

## 2020-02-21 DIAGNOSIS — I10 Essential (primary) hypertension: Secondary | ICD-10-CM | POA: Diagnosis not present

## 2020-02-21 DIAGNOSIS — E119 Type 2 diabetes mellitus without complications: Secondary | ICD-10-CM | POA: Diagnosis not present

## 2020-02-21 DIAGNOSIS — E785 Hyperlipidemia, unspecified: Secondary | ICD-10-CM | POA: Diagnosis not present

## 2020-02-21 DIAGNOSIS — E1169 Type 2 diabetes mellitus with other specified complication: Secondary | ICD-10-CM | POA: Diagnosis not present

## 2020-06-17 DIAGNOSIS — M170 Bilateral primary osteoarthritis of knee: Secondary | ICD-10-CM | POA: Diagnosis not present

## 2020-06-17 DIAGNOSIS — E1169 Type 2 diabetes mellitus with other specified complication: Secondary | ICD-10-CM | POA: Diagnosis not present

## 2020-06-17 DIAGNOSIS — I10 Essential (primary) hypertension: Secondary | ICD-10-CM | POA: Diagnosis not present

## 2020-06-17 DIAGNOSIS — E119 Type 2 diabetes mellitus without complications: Secondary | ICD-10-CM | POA: Diagnosis not present

## 2020-07-03 DIAGNOSIS — I10 Essential (primary) hypertension: Secondary | ICD-10-CM | POA: Diagnosis not present

## 2020-07-03 DIAGNOSIS — Z8719 Personal history of other diseases of the digestive system: Secondary | ICD-10-CM | POA: Diagnosis not present

## 2020-07-03 DIAGNOSIS — H04203 Unspecified epiphora, bilateral lacrimal glands: Secondary | ICD-10-CM | POA: Diagnosis not present

## 2020-07-03 DIAGNOSIS — E785 Hyperlipidemia, unspecified: Secondary | ICD-10-CM | POA: Diagnosis not present

## 2020-07-09 DIAGNOSIS — H2513 Age-related nuclear cataract, bilateral: Secondary | ICD-10-CM | POA: Diagnosis not present

## 2020-11-20 DIAGNOSIS — I10 Essential (primary) hypertension: Secondary | ICD-10-CM | POA: Diagnosis not present

## 2020-11-20 DIAGNOSIS — E785 Hyperlipidemia, unspecified: Secondary | ICD-10-CM | POA: Diagnosis not present

## 2020-11-20 DIAGNOSIS — E1169 Type 2 diabetes mellitus with other specified complication: Secondary | ICD-10-CM | POA: Diagnosis not present

## 2020-11-20 DIAGNOSIS — E119 Type 2 diabetes mellitus without complications: Secondary | ICD-10-CM | POA: Diagnosis not present

## 2020-12-19 DIAGNOSIS — I10 Essential (primary) hypertension: Secondary | ICD-10-CM | POA: Diagnosis not present

## 2020-12-19 DIAGNOSIS — M170 Bilateral primary osteoarthritis of knee: Secondary | ICD-10-CM | POA: Diagnosis not present

## 2020-12-19 DIAGNOSIS — E1169 Type 2 diabetes mellitus with other specified complication: Secondary | ICD-10-CM | POA: Diagnosis not present

## 2020-12-19 DIAGNOSIS — E119 Type 2 diabetes mellitus without complications: Secondary | ICD-10-CM | POA: Diagnosis not present

## 2021-01-01 DIAGNOSIS — Z0000 Encounter for general adult medical examination without abnormal findings: Secondary | ICD-10-CM | POA: Diagnosis not present

## 2021-01-01 DIAGNOSIS — I10 Essential (primary) hypertension: Secondary | ICD-10-CM | POA: Diagnosis not present

## 2021-01-01 DIAGNOSIS — E785 Hyperlipidemia, unspecified: Secondary | ICD-10-CM | POA: Diagnosis not present

## 2021-01-01 DIAGNOSIS — E1169 Type 2 diabetes mellitus with other specified complication: Secondary | ICD-10-CM | POA: Diagnosis not present

## 2021-02-03 DIAGNOSIS — M170 Bilateral primary osteoarthritis of knee: Secondary | ICD-10-CM | POA: Diagnosis not present

## 2021-02-03 DIAGNOSIS — E785 Hyperlipidemia, unspecified: Secondary | ICD-10-CM | POA: Diagnosis not present

## 2021-02-03 DIAGNOSIS — I10 Essential (primary) hypertension: Secondary | ICD-10-CM | POA: Diagnosis not present

## 2021-02-03 DIAGNOSIS — E1169 Type 2 diabetes mellitus with other specified complication: Secondary | ICD-10-CM | POA: Diagnosis not present

## 2021-03-18 DIAGNOSIS — E1169 Type 2 diabetes mellitus with other specified complication: Secondary | ICD-10-CM | POA: Diagnosis not present

## 2021-03-18 DIAGNOSIS — I10 Essential (primary) hypertension: Secondary | ICD-10-CM | POA: Diagnosis not present

## 2021-03-18 DIAGNOSIS — E785 Hyperlipidemia, unspecified: Secondary | ICD-10-CM | POA: Diagnosis not present

## 2021-03-18 DIAGNOSIS — M170 Bilateral primary osteoarthritis of knee: Secondary | ICD-10-CM | POA: Diagnosis not present

## 2021-06-15 DIAGNOSIS — I10 Essential (primary) hypertension: Secondary | ICD-10-CM | POA: Diagnosis not present

## 2021-06-15 DIAGNOSIS — E785 Hyperlipidemia, unspecified: Secondary | ICD-10-CM | POA: Diagnosis not present

## 2021-06-15 DIAGNOSIS — M170 Bilateral primary osteoarthritis of knee: Secondary | ICD-10-CM | POA: Diagnosis not present

## 2021-06-15 DIAGNOSIS — E1169 Type 2 diabetes mellitus with other specified complication: Secondary | ICD-10-CM | POA: Diagnosis not present

## 2021-09-16 DIAGNOSIS — H26492 Other secondary cataract, left eye: Secondary | ICD-10-CM | POA: Diagnosis not present

## 2021-10-12 DIAGNOSIS — H25811 Combined forms of age-related cataract, right eye: Secondary | ICD-10-CM | POA: Diagnosis not present

## 2021-10-19 DIAGNOSIS — I10 Essential (primary) hypertension: Secondary | ICD-10-CM | POA: Diagnosis not present

## 2021-10-19 DIAGNOSIS — E785 Hyperlipidemia, unspecified: Secondary | ICD-10-CM | POA: Diagnosis not present

## 2021-10-19 DIAGNOSIS — E1169 Type 2 diabetes mellitus with other specified complication: Secondary | ICD-10-CM | POA: Diagnosis not present

## 2021-11-18 DIAGNOSIS — H2511 Age-related nuclear cataract, right eye: Secondary | ICD-10-CM | POA: Diagnosis not present

## 2021-11-18 DIAGNOSIS — H25811 Combined forms of age-related cataract, right eye: Secondary | ICD-10-CM | POA: Diagnosis not present

## 2022-01-29 DIAGNOSIS — E785 Hyperlipidemia, unspecified: Secondary | ICD-10-CM | POA: Diagnosis not present

## 2022-01-29 DIAGNOSIS — E1169 Type 2 diabetes mellitus with other specified complication: Secondary | ICD-10-CM | POA: Diagnosis not present

## 2022-01-29 DIAGNOSIS — M170 Bilateral primary osteoarthritis of knee: Secondary | ICD-10-CM | POA: Diagnosis not present

## 2022-01-29 DIAGNOSIS — I10 Essential (primary) hypertension: Secondary | ICD-10-CM | POA: Diagnosis not present

## 2022-05-25 DIAGNOSIS — E1169 Type 2 diabetes mellitus with other specified complication: Secondary | ICD-10-CM | POA: Diagnosis not present

## 2022-05-25 DIAGNOSIS — I10 Essential (primary) hypertension: Secondary | ICD-10-CM | POA: Diagnosis not present

## 2022-05-25 DIAGNOSIS — Z0000 Encounter for general adult medical examination without abnormal findings: Secondary | ICD-10-CM | POA: Diagnosis not present

## 2022-05-25 DIAGNOSIS — E785 Hyperlipidemia, unspecified: Secondary | ICD-10-CM | POA: Diagnosis not present

## 2022-05-25 DIAGNOSIS — M170 Bilateral primary osteoarthritis of knee: Secondary | ICD-10-CM | POA: Diagnosis not present

## 2022-06-14 DIAGNOSIS — M170 Bilateral primary osteoarthritis of knee: Secondary | ICD-10-CM | POA: Diagnosis not present

## 2022-06-14 DIAGNOSIS — I10 Essential (primary) hypertension: Secondary | ICD-10-CM | POA: Diagnosis not present

## 2022-06-14 DIAGNOSIS — E785 Hyperlipidemia, unspecified: Secondary | ICD-10-CM | POA: Diagnosis not present

## 2022-06-14 DIAGNOSIS — E1169 Type 2 diabetes mellitus with other specified complication: Secondary | ICD-10-CM | POA: Diagnosis not present

## 2022-07-20 DIAGNOSIS — M1712 Unilateral primary osteoarthritis, left knee: Secondary | ICD-10-CM | POA: Diagnosis not present

## 2022-07-20 DIAGNOSIS — M1711 Unilateral primary osteoarthritis, right knee: Secondary | ICD-10-CM | POA: Diagnosis not present

## 2022-07-27 DIAGNOSIS — M1711 Unilateral primary osteoarthritis, right knee: Secondary | ICD-10-CM | POA: Diagnosis not present

## 2022-08-25 DIAGNOSIS — E1169 Type 2 diabetes mellitus with other specified complication: Secondary | ICD-10-CM | POA: Diagnosis not present

## 2022-09-07 DIAGNOSIS — M170 Bilateral primary osteoarthritis of knee: Secondary | ICD-10-CM | POA: Diagnosis not present

## 2022-09-30 DIAGNOSIS — I10 Essential (primary) hypertension: Secondary | ICD-10-CM | POA: Diagnosis not present

## 2022-09-30 DIAGNOSIS — E1169 Type 2 diabetes mellitus with other specified complication: Secondary | ICD-10-CM | POA: Diagnosis not present

## 2022-09-30 DIAGNOSIS — M170 Bilateral primary osteoarthritis of knee: Secondary | ICD-10-CM | POA: Diagnosis not present

## 2022-09-30 DIAGNOSIS — E785 Hyperlipidemia, unspecified: Secondary | ICD-10-CM | POA: Diagnosis not present

## 2022-11-08 DIAGNOSIS — H5203 Hypermetropia, bilateral: Secondary | ICD-10-CM | POA: Diagnosis not present

## 2023-04-18 ENCOUNTER — Other Ambulatory Visit: Payer: Self-pay | Admitting: Family Medicine

## 2023-04-18 DIAGNOSIS — Z1231 Encounter for screening mammogram for malignant neoplasm of breast: Secondary | ICD-10-CM

## 2023-05-02 DIAGNOSIS — M1712 Unilateral primary osteoarthritis, left knee: Secondary | ICD-10-CM | POA: Diagnosis not present

## 2023-05-24 ENCOUNTER — Inpatient Hospital Stay: Admission: RE | Admit: 2023-05-24 | Payer: BLUE CROSS/BLUE SHIELD | Source: Ambulatory Visit

## 2023-05-31 DIAGNOSIS — I10 Essential (primary) hypertension: Secondary | ICD-10-CM | POA: Diagnosis not present

## 2023-05-31 DIAGNOSIS — E785 Hyperlipidemia, unspecified: Secondary | ICD-10-CM | POA: Diagnosis not present

## 2023-05-31 DIAGNOSIS — E119 Type 2 diabetes mellitus without complications: Secondary | ICD-10-CM | POA: Diagnosis not present

## 2023-05-31 DIAGNOSIS — Z0000 Encounter for general adult medical examination without abnormal findings: Secondary | ICD-10-CM | POA: Diagnosis not present

## 2023-05-31 DIAGNOSIS — E1169 Type 2 diabetes mellitus with other specified complication: Secondary | ICD-10-CM | POA: Diagnosis not present

## 2023-07-07 ENCOUNTER — Encounter (INDEPENDENT_AMBULATORY_CARE_PROVIDER_SITE_OTHER): Payer: Self-pay | Admitting: Adult Health

## 2023-07-07 ENCOUNTER — Ambulatory Visit (INDEPENDENT_AMBULATORY_CARE_PROVIDER_SITE_OTHER): Payer: Medicare Other | Admitting: Adult Health

## 2023-07-07 VITALS — BP 177/104 | HR 108 | Temp 97.8°F | Ht 64.0 in | Wt 210.0 lb

## 2023-07-07 DIAGNOSIS — E1159 Type 2 diabetes mellitus with other circulatory complications: Secondary | ICD-10-CM | POA: Diagnosis not present

## 2023-07-07 DIAGNOSIS — Z6836 Body mass index (BMI) 36.0-36.9, adult: Secondary | ICD-10-CM | POA: Diagnosis not present

## 2023-07-07 DIAGNOSIS — Z0289 Encounter for other administrative examinations: Secondary | ICD-10-CM

## 2023-07-07 DIAGNOSIS — I152 Hypertension secondary to endocrine disorders: Secondary | ICD-10-CM | POA: Diagnosis not present

## 2023-07-07 DIAGNOSIS — E119 Type 2 diabetes mellitus without complications: Secondary | ICD-10-CM

## 2023-07-07 NOTE — Progress Notes (Signed)
Office: [phone]  /  Fax: [phone]   Initial Visit  [name] was seen in clinic today to evaluate for obesity. She is interested in losing weight to improve overall health and reduce the risk of weight related complications. She presents today to review program treatment options, initial physical assessment, and evaluation.     She was referred by: PCP  When asked what else they would like to accomplish? She states: Adopt healthier eating patterns, Improve energy levels and physical activity, and Improve existing medical conditions  Weight history: She reports concerning weight gain since 2020/07/29  When asked how has your weight affected you? She states: Contributed to medical problems, Contributed to orthopedic problems or mobility issues, and Having fatigue  Some associated conditions: Hypertension, Hyperlipidemia, and Diabetes  Contributing factors: Consumption of processed foods and Eating patterns  Weight promoting medications identified: None  Current nutrition plan: None  Current level of physical activity: Walking  Current or previous pharmacotherapy: None  Response to medication: Never tried medications   Past medical history includes:   Past Medical History:  Diagnosis Date   Arthritis    "knees" (08/20/2014)   Diverticulitis    Dry eyes    recent treatment   Fibroids    GERD (gastroesophageal reflux disease)    occas.diet contolled - no meds   Headache    "weekly; related to BP" (08/20/2014)   High cholesterol    diet controlled - no meds   Hypertension    Situational anxiety 07/29/2013   "son passed away"   SVD (spontaneous vaginal delivery)    x 2     Objective:   BP (!) 177/104   Pulse (!) 108   Temp 97.8 F (36.6 C)   Ht 5\' 4"  (1.626 m)   Wt 210 lb (95.3 kg)   SpO2 92%   BMI 36.05 kg/m  She was weighed on the bioimpedance scale: Body mass index is 36.05 kg/m.  Peak Weight: 218 , Body Fat%:45.1, Visceral Fat Rating:14, Weight trend  over the last 12 months: Unchanged  BP above goal at OV.  Per pt morning BP was 138/87.  She had a large serving of Ribs late this afternoon.  She denies acute cardiac sx's.  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  General:  Alert, oriented and cooperative. Patient is in no acute distress.  Respiratory: Normal respiratory effort, no problems with respiration noted   Gait: able to ambulate independently  Mental Status: Normal mood and affect. Normal behavior. Normal judgment and thought content.   DIAGNOSTIC DATA REVIEWED:  BMET    Component Value Date/Time   NA 139 08/10/2015 1624   K 4.3 08/10/2015 1624   CL 107 08/10/2015 1624   CO2 26 08/10/2015 1624   GLUCOSE 107 (H) 08/10/2015 1624   BUN 15 08/10/2015 1624   CREATININE 1.21 (H) 08/10/2015 1624   CALCIUM 9.1 08/10/2015 1624   GFRNONAA 46 (L) 08/10/2015 1624   GFRAA 54 (L) 08/10/2015 1624   No results found for: "HGBA1C" No results found for: "INSULIN" CBC    Component Value Date/Time   WBC 7.6 08/08/2015 1310   RBC 4.58 08/08/2015 1310   HGB 13.0 08/08/2015 1310   HCT 39.5 08/08/2015 1310   PLT 435 (H) 08/08/2015 1310   MCV 86.2 08/08/2015 1310   MCH 28.4 08/08/2015 1310   MCHC 32.9 08/08/2015 1310   RDW 14.3 08/08/2015 1310   Iron/TIBC/Ferritin/ %Sat No results found for: "IRON", "TIBC", "FERRITIN", "IRONPCTSAT" Lipid Panel  Component Value Date/Time   CHOL  02/26/2010 1120    166        ATP III CLASSIFICATION:  <200     mg/dL   Desirable  573-220  mg/dL   Borderline High  >=254    mg/dL   High          TRIG 80 02/26/2010 1120   HDL 33 (L) 02/26/2010 1120   CHOLHDL 5.0 02/26/2010 1120   VLDL 16 02/26/2010 1120   LDLCALC (H) 02/26/2010 1120    117        Total Cholesterol/HDL:CHD Risk Coronary Heart Disease Risk Table                     Men   Women  1/2 Average Risk   3.4   3.3  Average Risk       5.0   4.4  2 X Average Risk   9.6   7.1  3 X Average Risk  23.4   11.0        Use the calculated  Patient Ratio above and the CHD Risk Table to determine the patient's CHD Risk.        ATP III CLASSIFICATION (LDL):  <100     mg/dL   Optimal  270-623  mg/dL   Near or Above                    Optimal  130-159  mg/dL   Borderline  762-831  mg/dL   High  >517     mg/dL   Very High   Hepatic Function Panel     Component Value Date/Time   PROT 7.3 01/09/2015 1934   ALBUMIN 3.3 (L) 01/09/2015 1934   AST 16 01/09/2015 1934   ALT 17 01/09/2015 1934   ALKPHOS 66 01/09/2015 1934   BILITOT 0.7 01/09/2015 1934   Assessment and Plan:   Type 2 diabetes mellitus without complication, without long-term current use of insulin (HCC)  Hypertension associated with diabetes (HCC)  Morbid obesity (HCC), Starting BMI 36.03  ESTABLISH WITH HWW   Obesity Treatment / Action Plan:  Patient will work on garnering support from family and friends to begin weight loss journey. Will work on eliminating or reducing the presence of highly palatable, calorie dense foods in the home. Will complete provided nutritional and psychosocial assessment questionnaire before the next appointment. Will be scheduled for indirect calorimetry to determine resting energy expenditure in a fasting state.  This will allow [place] to create a reduced calorie, high-protein meal plan to promote loss of fat mass while preserving muscle mass. Counseled on the health benefits of losing 5%-15% of total body weight. Was counseled on nutritional approaches to weight loss and benefits of reducing processed foods and consuming plant-based foods and high quality protein as part of nutritional weight management. Was counseled on pharmacotherapy and role as an adjunct in weight management.   Obesity Education Performed Today:  She was weighed on the bioimpedance scale and results were discussed and documented in the synopsis.  We discussed obesity as a disease and the importance of a more detailed evaluation of all the factors  contributing to the disease.  We discussed the importance of long term lifestyle changes which include nutrition, exercise and behavioral modifications as well as the importance of customizing this to her specific health and social needs.  We discussed the benefits of reaching a healthier weight to alleviate the symptoms of existing conditions and  reduce the risks of the biomechanical, metabolic and psychological effects of obesity.  [name] appears to be in the action stage of change and states they are ready to start intensive lifestyle modifications and behavioral modifications.  30 minutes was spent today on this visit including the above counseling, pre-visit chart review, and post-visit documentation.  Reviewed by clinician on day of visit: allergies, medications, problem list, medical history, surgical history, family history, social history, and previous encounter notes pertinent to obesity diagnosis.   Miroslava Santellan d. [place] Stanger, NP-C

## 2023-07-24 DIAGNOSIS — E119 Type 2 diabetes mellitus without complications: Secondary | ICD-10-CM | POA: Diagnosis not present

## 2023-08-09 ENCOUNTER — Ambulatory Visit (INDEPENDENT_AMBULATORY_CARE_PROVIDER_SITE_OTHER): Payer: Medicare Other | Admitting: Internal Medicine

## 2023-08-09 ENCOUNTER — Encounter (INDEPENDENT_AMBULATORY_CARE_PROVIDER_SITE_OTHER): Payer: Self-pay | Admitting: Internal Medicine

## 2023-08-09 VITALS — BP 140/76 | HR 70 | Temp 97.8°F | Ht 64.0 in | Wt 210.0 lb

## 2023-08-09 DIAGNOSIS — E66812 Obesity, class 2: Secondary | ICD-10-CM

## 2023-08-09 DIAGNOSIS — K760 Fatty (change of) liver, not elsewhere classified: Secondary | ICD-10-CM | POA: Diagnosis not present

## 2023-08-09 DIAGNOSIS — I10 Essential (primary) hypertension: Secondary | ICD-10-CM

## 2023-08-09 DIAGNOSIS — R0602 Shortness of breath: Secondary | ICD-10-CM | POA: Diagnosis not present

## 2023-08-09 DIAGNOSIS — K8689 Other specified diseases of pancreas: Secondary | ICD-10-CM | POA: Diagnosis not present

## 2023-08-09 DIAGNOSIS — Z6836 Body mass index (BMI) 36.0-36.9, adult: Secondary | ICD-10-CM | POA: Diagnosis not present

## 2023-08-09 DIAGNOSIS — E1169 Type 2 diabetes mellitus with other specified complication: Secondary | ICD-10-CM | POA: Diagnosis not present

## 2023-08-09 DIAGNOSIS — Z1331 Encounter for screening for depression: Secondary | ICD-10-CM | POA: Diagnosis not present

## 2023-08-09 DIAGNOSIS — R5383 Other fatigue: Secondary | ICD-10-CM | POA: Diagnosis not present

## 2023-08-09 DIAGNOSIS — Z7984 Long term (current) use of oral hypoglycemic drugs: Secondary | ICD-10-CM

## 2023-08-09 NOTE — Assessment & Plan Note (Addendum)
History unclear, 10 years ago, reports having biopsies at a surgical center in the area. Patient might have been lost to follow-up.  We will review records further to make sure that she is up-to-date on surveillance.  She has no history of pancreatitis so I do not see a contraindication for GLP-1 therapy which I think would be of benefit for her.

## 2023-08-09 NOTE — Assessment & Plan Note (Addendum)
Last Aic reported by patient 7.3, had eye exam this year.  She is on glipizide and Jardiance.  This combination may increase the risk of hypoglycemia and glipizide may cause weight gain and compensatory eating.  She had been on metformin in the past and had mild intolerance so we may be able to try extended release metformin.  I think she would also benefit from GLP-1 therapy if no contraindications exist.  She has a thyroid nodule that I need to investigate further.  We are checking fasting lipid profile and UMA today her blood pressure is not at goal.  She is also not on statin so has a few gaps in her care.

## 2023-08-09 NOTE — Assessment & Plan Note (Signed)
 See obesity treatment plan

## 2023-08-09 NOTE — Progress Notes (Signed)
Office: [phone]  /  Fax: [phone]   Subjective   Initial Visit  [name] (MR# [id number]) is a 72 y.o. female who presents for evaluation and treatment of obesity and related comorbidities. Current BMI is Body mass index is 36.05 kg/m. [name] has been struggling with her weight for many years and has been unsuccessful in either losing weight, maintaining weight loss, or reaching her healthy weight goal.  [name] is currently in the action stage of change and ready to dedicate time achieving and maintaining a healthier weight. [name] is interested in becoming our patient and working on intensive lifestyle modifications including (but not limited to) diet and exercise for weight loss.  When asked how has your weight affected you? She states: Contributed to medical problems, Contributed to orthopedic problems or mobility issues, and Having fatigue   When asked what else they would like to accomplish? She states: Adopt healthier eating patterns, Improve energy levels and physical activity, and Improve existing medical conditions    Weight history:  She starting to note weight gain during : adulthood and menopause.  Life events associated with weight gain include :  loss of son and multiple life challenges .   Other contributing factors: Consumption of processed foods, Use of obesogenic medications: Obesogenic diabetes medications, Eating patterns, and Menopause.  Their highest weight has been:  222 lbs.  Desired weight: 190  Previous weight-loss programs : None.  Their maximum weight loss was:  NA lbs.  Their greatest challenge with dieting: never tried dieting before.  Weight promoting medications identified: Obesogenic diabetes medications  Current or previous pharmacotherapy: None.  Response to medication: Never tried medications  Nutritional History:  Current nutrition plan: None.  How many times do you eat outside the home: 2-4 per week  How often do they eat  breakfast : 3-5 days a week.  Number of times they eat per day: 1 meal, snacks often  What beverages do they drink: sweet tea 1-2 per week.   Use of artificial sweetners : Yes  Food intolerances or dislikes:  some vegetables .  Food triggers: None.  Food cravings: Sugary  Do they struggle with excessive hunger or portion control : No   Current level of physical activity: Walking  Past medical history includes:   Past Medical History:  Diagnosis Date   Arthritis    "knees" (08/20/2014)   Diverticulitis    Dry eyes    recent treatment   Fibroids    GERD (gastroesophageal reflux disease)    occas.diet contolled - no meds   Headache    "weekly; related to BP" (08/20/2014)   High cholesterol    diet controlled - no meds   Hypertension    Joint pain    Situational anxiety Sep 13, 2012   "son passed away"   SVD (spontaneous vaginal delivery)    x 2     Objective   BP (!) 140/76   Pulse 70   Temp 97.8 F (36.6 C)   Ht 5\' 4"  (1.626 m)   Wt 210 lb (95.3 kg)   SpO2 99%   BMI 36.05 kg/m  She was weighed on the bioimpedance scale: Body mass index is 36.05 kg/m.    Anthropometrics:  Vitals Temp: 97.8 F (36.6 C) BP: (!) 140/76 Pulse Rate: 70 SpO2: 99 %   Anthropometric Measurements Height: 5\' 4"  (1.626 m) Weight: 210 lb (95.3 kg) BMI (Calculated): 36.03 Starting Weight: 210 lb Peak Weight: 222 lb Waist Measurement : 44 inches  Body Composition  Body Fat %: 44.6 % Fat Mass (lbs): 94 lbs Muscle Mass (lbs): 110.8 lbs Total Body Water (lbs): 76.4 lbs Visceral Fat Rating : 14   Other Clinical Data RMR: 1800 Fasting: Yes Labs: Yes Today's Visit #: 1 Starting Date: 08/09/23    Physical Exam:  General: She is overweight, cooperative, alert, well developed, and in no acute distress. PSYCH: Has normal mood, affect and thought process.   HEENT: EOMI, sclerae are anicteric. Lungs: Normal breathing effort, no conversational dyspnea. Extremities:  No edema.  Neurologic: No gross sensory or motor deficits. No tremors or fasciculations noted.    Diagnostic Data Reviewed  EKG: Normal sinus rhythm, rate 70 BPM. No conduction abnormalities, abnormal Q waves or chamber enlargement.  Indirect Calorimeter completed today shows a VO2 of 260 and a REE of 1800.  Her calculated basal metabolic rate is 2376 thus her resting energy expenditure faster than calculated.  Depression Screen  Lashanta's PHQ-9 score was: 4.      No data to display          Screening for Sleep Related Breathing Disorders  Rebcca denies daytime somnolence and admits to waking up still tired. Patient has a history of symptoms of daytime fatigue. Noellie generally gets 7 hours of sleep per night, and states that she has generally restful sleep. Snoring is not present. Apneic episodes are not present. Epworth Sleepiness Score is 5.   BMET    Component Value Date/Time   NA 139 08/10/2015 1624   K 4.3 08/10/2015 1624   CL 107 08/10/2015 1624   CO2 26 08/10/2015 1624   GLUCOSE 107 (H) 08/10/2015 1624   BUN 15 08/10/2015 1624   CREATININE 1.21 (H) 08/10/2015 1624   CALCIUM 9.1 08/10/2015 1624   GFRNONAA 46 (L) 08/10/2015 1624   GFRAA 54 (L) 08/10/2015 1624   No results found for: "HGBA1C" No results found for: "INSULIN" CBC    Component Value Date/Time   WBC 7.6 08/08/2015 1310   RBC 4.58 08/08/2015 1310   HGB 13.0 08/08/2015 1310   HCT 39.5 08/08/2015 1310   PLT 435 (H) 08/08/2015 1310   MCV 86.2 08/08/2015 1310   MCH 28.4 08/08/2015 1310   MCHC 32.9 08/08/2015 1310   RDW 14.3 08/08/2015 1310   Iron/TIBC/Ferritin/ %Sat No results found for: "IRON", "TIBC", "FERRITIN", "IRONPCTSAT" Lipid Panel     Component Value Date/Time   CHOL  02/26/2010 1120    166        ATP III CLASSIFICATION:  <200     mg/dL   Desirable  283-151  mg/dL   Borderline High  >=761    mg/dL   High          TRIG 80 02/26/2010 1120   HDL 33 (L) 02/26/2010 1120   CHOLHDL 5.0  02/26/2010 1120   VLDL 16 02/26/2010 1120   LDLCALC (H) 02/26/2010 1120    117        Total Cholesterol/HDL:CHD Risk Coronary Heart Disease Risk Table                     Men   Women  1/2 Average Risk   3.4   3.3  Average Risk       5.0   4.4  2 X Average Risk   9.6   7.1  3 X Average Risk  23.4   11.0        Use the calculated Patient Ratio above and the  CHD Risk Table to determine the patient's CHD Risk.        ATP III CLASSIFICATION (LDL):  <100     mg/dL   Optimal  161-096  mg/dL   Near or Above                    Optimal  130-159  mg/dL   Borderline  045-409  mg/dL   High  >811     mg/dL   Very High   Hepatic Function Panel     Component Value Date/Time   PROT 7.3 01/09/2015 1934   ALBUMIN 3.3 (L) 01/09/2015 1934   AST 16 01/09/2015 1934   ALT 17 01/09/2015 1934   ALKPHOS 66 01/09/2015 1934   BILITOT 0.7 01/09/2015 1934      Assessment and Plan   TREATMENT PLAN FOR OBESITY:  Recommended Dietary Goals  Callianne is currently in the action stage of change. As such, her goal is to implement medically supervised weight loss plan.  She has agreed to implement: the Category 2 plan - 1200 kcal per day  Behavioral Intervention  We discussed the following Behavioral Modification Strategies today: increasing lean protein intake to established goals, decreasing simple carbohydrates , increasing vegetables, increasing lower glycemic fruits, increasing fiber rich foods, avoiding skipping meals, increasing water intake, work on meal planning and preparation, reading food labels , keeping healthy foods at home, identifying sources and decreasing liquid calories, decreasing eating out or consumption of processed foods, and making healthy choices when eating convenient foods, planning for success, and better snacking choices  Additional resources provided today:  Category 2 packet  Recommended Physical Activity Goals  Anntoinette has been advised to work up to 150 minutes of moderate  intensity aerobic activity a week and strengthening exercises 2-3 times per week for cardiovascular health, weight loss maintenance and preservation of muscle mass.   She has agreed to :  Think about enjoyable ways to increase daily physical activity and overcoming barriers to exercise and Increase physical activity in their day and reduce sedentary time (increase NEAT).  Pharmacotherapy We will work on building a Therapist, art and behavioral strategies. We will discuss the role of pharmacotherapy as an adjunct at subsequent visits.   ASSOCIATED CONDITIONS ADDRESSED TODAY  Other Fatigue Laynah denies daytime somnolence and admits to waking up still tired. Patient has a history of symptoms of daytime fatigue. Larcenia generally gets 7 hours of sleep per night, and states that she has generally restful sleep. Snoring is not present. Apneic episodes are not present. Epworth Sleepiness Score is 5. Bonita Quin does feel that her weight is causing her energy to be lower than it should be. Fatigue may be related to obesity, depression or many other causes. Labs will be ordered, and in the meanwhile, Nadyne will focus on self care including making healthy food choices, increasing physical activity and focusing on stress reduction.  Shortness of Breath Lyfe notes increasing shortness of breath with exercising and seems to be worsening over time with weight gain. She notes getting out of breath sooner with activity than she used to. This has not gotten worse recently. Meshia denies shortness of breath at rest or orthopnea.Alyssandra notes increasing shortness of breath with exercising and seems to be worsening over time with weight gain. She notes getting out of breath sooner with activity than she used to. This has not gotten worse recently. Sherilynn denies shortness of breath at rest or orthopnea.  Other fatigue -  EKG 12-Lead -     Vitamin B12 -     CBC with Differential/Platelet  SOB  (shortness of breath) on exertion  Depression screen  Essential hypertension Assessment & Plan: Elevated this AM, has not taken meds. On Valsartan and HCTZ.  I will like for her to check her blood pressure in the mornings and also before bedtime as her medications may not be providing 24-hour coverage.  She may need an evening medication.  We discussed the risk associated with uncontrolled blood pressure and goals of care.  We are also checking renal parameters and UMA today.   Type 2 diabetes mellitus with other specified complication, without long-term current use of insulin (HCC) Assessment & Plan: Last Aic reported by patient 7.3, had eye exam this year.  She is on glipizide and Jardiance.  This combination may increase the risk of hypoglycemia and glipizide may cause weight gain and compensatory eating.  She had been on metformin in the past and had mild intolerance so we may be able to try extended release metformin.  I think she would also benefit from GLP-1 therapy if no contraindications exist.  She has a thyroid nodule that I need to investigate further.  We are checking fasting lipid profile and UMA today her blood pressure is not at goal.  She is also not on statin so has a few gaps in her care.  Orders: -     Hemoglobin A1c -     Insulin, random -     Lipid Panel With LDL/HDL Ratio -     Microalbumin / creatinine urine ratio  Class 2 severe obesity with serious comorbidity and body mass index (BMI) of 36.0 to 36.9 in adult, unspecified obesity type (HCC) Assessment & Plan: See obesity treatment plan  Orders: -     TSH -     VITAMIN D 25 Hydroxy (Vit-D Deficiency, Fractures)  Metabolic dysfunction-associated steatotic liver disease (MASLD) Assessment & Plan: Found on MRI of the abdomen in 2016 I do not have any recent liver enzymes.  We are checking liver enzymes today as well as PT and INR and a CBC.  We discussed reducing saturated fats simple and complex carbs in diet.   Losing 15% of body weight may improve condition.  She may also benefit from GLP-1 therapy.  She does not drink alcohol and is not on steatogenic drugs.  There is no family history of liver disease.  Orders: -     Comprehensive metabolic panel -     Protime-INR  Pancreatic mass Assessment & Plan: History unclear, 10 years ago, reports having biopsies at a surgical center in the area. Patient might have been lost to follow-up.  We will review records further to make sure that she is up-to-date on surveillance.  She has no history of pancreatitis so I do not see a contraindication for GLP-1 therapy which I think would be of benefit for her.     Follow-up  She was informed of the importance of frequent follow-up visits to maximize her success with intensive lifestyle modifications for her multiple health conditions. She was informed we would discuss her lab results at her next visit unless there is a critical issue that needs to be addressed sooner. Willamae agreed to keep her next visit at the agreed upon time to discuss these results.  Attestation Statement  This is the patient's intake visit at [facility]. The patient's Health Questionnaire was reviewed at length. Included in the  packet: current and past health history, medications, allergies, ROS, gynecologic history (women only), surgical history, family history, social history, weight history, weight loss surgery history (for those that have had weight loss surgery), nutritional evaluation, mood and food questionnaire, PHQ9, Epworth questionnaire, sleep habits questionnaire, patient life and health improvement goals questionnaire. These will all be scanned into the patient's chart under media.   During the visit, I independently reviewed the patient's EKG, previous labs, bioimpedance scale results, and indirect calorimetry results. I used this information to medically tailor a meal plan for the patient that will help her to lose  weight and will improve her obesity-related conditions. I performed a medically necessary appropriate examination and/or evaluation. I discussed the assessment and treatment plan with the patient. The patient was provided an opportunity to ask questions and all were answered. The patient agreed with the plan and demonstrated an understanding of the instructions. Labs were ordered at this visit and will be reviewed at the next visit unless critical results need to be addressed immediately. Clinical information was updated and documented in the EMR.   In addition, they received basic education on identification of processed foods and reduction of these, different sources of lean proteins and complex carbohydrates and how to eat balanced by incorporation of whole foods.  Reviewed by clinician on day of visit: allergies, medications, problem list, medical history, surgical history, family history, social history, and previous encounter notes.  I have spent 60 minutes in the care of the patient today including: preparing to see patient (e.g. review and interpretation of tests, old notes ), obtaining and/or reviewing separately obtained history, performing a medically appropriate examination or evaluation, counseling and educating the patient, ordering medications, test or procedures, documenting clinical information in the electronic or other health care record, and independently interpreting results and communicating results to the patient, family, or caregiver      Worthy Rancher, MD

## 2023-08-09 NOTE — Assessment & Plan Note (Addendum)
Elevated this AM, has not taken meds. On Valsartan and HCTZ.  I will like for her to check her blood pressure in the mornings and also before bedtime as her medications may not be providing 24-hour coverage.  She may need an evening medication.  We discussed the risk associated with uncontrolled blood pressure and goals of care.  We are also checking renal parameters and UMA today.

## 2023-08-09 NOTE — Assessment & Plan Note (Signed)
Found on MRI of the abdomen in 2016 I do not have any recent liver enzymes.  We are checking liver enzymes today as well as PT and INR and a CBC.  We discussed reducing saturated fats simple and complex carbs in diet.  Losing 15% of body weight may improve condition.  She may also benefit from GLP-1 therapy.  She does not drink alcohol and is not on steatogenic drugs.  There is no family history of liver disease.

## 2023-08-10 LAB — COMPREHENSIVE METABOLIC PANEL
ALT: 20 [IU]/L (ref 0–32)
AST: 18 [IU]/L (ref 0–40)
Albumin: 4.2 g/dL (ref 3.8–4.8)
Alkaline Phosphatase: 80 [IU]/L (ref 44–121)
BUN/Creatinine Ratio: 21 (ref 12–28)
BUN: 17 mg/dL (ref 8–27)
Bilirubin Total: 0.5 mg/dL (ref 0.0–1.2)
CO2: 23 mmol/L (ref 20–29)
Calcium: 9.4 mg/dL (ref 8.7–10.3)
Chloride: 99 mmol/L (ref 96–106)
Creatinine, Ser: 0.8 mg/dL (ref 0.57–1.00)
Globulin, Total: 2.5 g/dL (ref 1.5–4.5)
Glucose: 128 mg/dL — ABNORMAL HIGH (ref 70–99)
Potassium: 4.3 mmol/L (ref 3.5–5.2)
Sodium: 139 mmol/L (ref 134–144)
Total Protein: 6.7 g/dL (ref 6.0–8.5)
eGFR: 78 mL/min/{1.73_m2} (ref >59)

## 2023-08-10 LAB — CBC WITH DIFFERENTIAL/PLATELET
Basophils Absolute: 0.1 10*3/uL (ref 0.0–0.2)
Basos: 2 %
EOS (ABSOLUTE): 0.2 10*3/uL (ref 0.0–0.4)
Eos: 6 %
Hematocrit: 45.5 % (ref 34.0–46.6)
Hemoglobin: 14.6 g/dL (ref 11.1–15.9)
Immature Grans (Abs): 0 10*3/uL (ref 0.0–0.1)
Immature Granulocytes: 0 %
Lymphocytes Absolute: 1.4 10*3/uL (ref 0.7–3.1)
Lymphs: 37 %
MCH: 29 pg (ref 26.6–33.0)
MCHC: 32.1 g/dL (ref 31.5–35.7)
MCV: 90 fL (ref 79–97)
Monocytes Absolute: 0.4 10*3/uL (ref 0.1–0.9)
Monocytes: 11 %
Neutrophils Absolute: 1.7 10*3/uL (ref 1.4–7.0)
Neutrophils: 44 %
Platelets: 338 10*3/uL (ref 150–450)
RBC: 5.04 x10E6/uL (ref 3.77–5.28)
RDW: 13.2 % (ref 11.7–15.4)
WBC: 3.9 10*3/uL (ref 3.4–10.8)

## 2023-08-10 LAB — HEMOGLOBIN A1C
Est. average glucose Bld gHb Est-mCnc: 197 mg/dL
Hgb A1c MFr Bld: 8.5 % — ABNORMAL HIGH (ref 4.8–5.6)

## 2023-08-10 LAB — TSH: TSH: 1.25 u[IU]/mL (ref 0.450–4.500)

## 2023-08-10 LAB — LIPID PANEL WITH LDL/HDL RATIO
Cholesterol, Total: 179 mg/dL (ref 100–199)
HDL: 44 mg/dL (ref >39)
LDL Chol Calc (NIH): 113 mg/dL — ABNORMAL HIGH (ref 0–99)
LDL/HDL Ratio: 2.6 {ratio} (ref 0.0–3.2)
Triglycerides: 120 mg/dL (ref 0–149)
VLDL Cholesterol Cal: 22 mg/dL (ref 5–40)

## 2023-08-10 LAB — PROTIME-INR
INR: 1 (ref 0.9–1.2)
Prothrombin Time: 11.1 s (ref 9.1–12.0)

## 2023-08-10 LAB — VITAMIN B12: Vitamin B-12: 318 pg/mL (ref 232–1245)

## 2023-08-10 LAB — VITAMIN D 25 HYDROXY (VIT D DEFICIENCY, FRACTURES): Vit D, 25-Hydroxy: 42.8 ng/mL (ref 30.0–100.0)

## 2023-08-10 LAB — INSULIN, RANDOM: INSULIN: 17.3 u[IU]/mL (ref 2.6–24.9)

## 2023-08-23 ENCOUNTER — Ambulatory Visit (INDEPENDENT_AMBULATORY_CARE_PROVIDER_SITE_OTHER): Payer: Self-pay | Admitting: Internal Medicine

## 2023-08-23 ENCOUNTER — Encounter (INDEPENDENT_AMBULATORY_CARE_PROVIDER_SITE_OTHER): Payer: Self-pay | Admitting: Internal Medicine

## 2023-08-23 VITALS — BP 103/61 | HR 87 | Temp 98.1°F | Ht 64.0 in | Wt 206.0 lb

## 2023-08-23 DIAGNOSIS — E66812 Morbid (severe) obesity due to excess calories: Secondary | ICD-10-CM

## 2023-08-23 DIAGNOSIS — Z7985 Long-term (current) use of injectable non-insulin antidiabetic drugs: Secondary | ICD-10-CM

## 2023-08-23 DIAGNOSIS — E1169 Type 2 diabetes mellitus with other specified complication: Secondary | ICD-10-CM | POA: Diagnosis not present

## 2023-08-23 DIAGNOSIS — K8689 Other specified diseases of pancreas: Secondary | ICD-10-CM

## 2023-08-23 DIAGNOSIS — K760 Fatty (change of) liver, not elsewhere classified: Secondary | ICD-10-CM | POA: Diagnosis not present

## 2023-08-23 DIAGNOSIS — I10 Essential (primary) hypertension: Secondary | ICD-10-CM | POA: Diagnosis not present

## 2023-08-23 DIAGNOSIS — Z6836 Body mass index (BMI) 36.0-36.9, adult: Secondary | ICD-10-CM

## 2023-08-23 MED ORDER — TIRZEPATIDE 2.5 MG/0.5ML ~~LOC~~ SOAJ: 2.5000 mg | SUBCUTANEOUS | 0 refills | Status: DC

## 2023-08-23 NOTE — Assessment & Plan Note (Addendum)
 Blood pressure at goal for age and risk category.  On amlodipine  without adverse effects.  Most recent renal parameters reviewed which showed normal electrolytes and kidney function.  Continue with weight loss therapy. Losing 10% may improve blood pressure control. Monitor for symptoms of orthostasis while losing weight. Continue current regimen and home monitoring for a goal blood pressure of 120/80.  Review records to see if she has had a urine microalbumin completed to see if she warrants ARB or ACE therapy

## 2023-08-23 NOTE — Assessment & Plan Note (Addendum)
 HgbA1c is not at goal for age and comorbid conditions. Denies symptoms of hypoglycemia or hyperglycemia. On glipizide, Jardiance.  She recently stopped glipizide because of concerns with weight gain.  She is also high risk for hypoglycemia on SGLT2 drug.    Lab Results  Component Value Date   HGBA1C 8.5 (H) 08/09/2023   Lab Results  Component Value Date   LDLCALC 113 (H) 08/09/2023   CREATININE 0.80 08/09/2023   Counseled on goals of care, monitoring for complications and importance of staying updated on immunizations and diabetes preventive measures.  She will continue with reduced calorie meal plan low on processed crabs and simple sugars. Ongoing weight loss will improve insulin  resistance and glycemic control.  She will follow-up with primary care team to update diabetes preventive measures.  Her blood pressure is well-controlled.  Her LDL cholesterol is above target and she may benefit from higher intensity statin therapy.  She will review this with her primary care team.  Patient also benefits from GLP-1 therapy.  After discussion of benefits and risk she will be started on Mounjaro  2.5 mg once a week.

## 2023-08-23 NOTE — Progress Notes (Signed)
 Office: [phone]  /  Fax: [phone]  Weight Summary And Biometrics  Vitals Temp: 98.1 F (36.7 C) BP: 103/61 Pulse Rate: 87 SpO2: 98 %   Anthropometric Measurements Height: 5' 4 (1.626 m) Weight: 206 lb (93.4 kg) BMI (Calculated): 35.34 Weight at Last Visit: 210 lb Weight Lost Since Last Visit: 4 lb Weight Gained Since Last Visit: 0 Starting Weight: 210 lb Total Weight Loss (lbs): 4 lb (1.814 kg) Peak Weight: 222 lb Waist Measurement : 44 inches   Body Composition  Body Fat %: 44.8 % Fat Mass (lbs): 113.8 lbs Muscle Mass (lbs): 108 lbs Total Body Water (lbs): 75.6 lbs Visceral Fat Rating : 14    RMR: 1800  Today's Visit #: 2  Starting Date: 08/09/23   Subjective   Chief Complaint: Obesity  [name] is here to discuss her progress with her obesity treatment plan. She is on the the Category 2 Plan and states she is following her eating plan approximately 75 % of the time. She states she is exercising several hours while at work 5 times per week.  Interval History:   Since last office visit she has lost 4 pounds. She reports good adherence to reduced calorie nutritional plan. She has been working on reading food labels, not skipping meals, increasing protein intake at every meal, eating more fruits, eating more vegetables, drinking more water, avoiding and / or reducing liquid calories, avoiding or reducing simple and processed carbohydrates, making healthier choices, continues to exercise, reducing portion sizes, and incorporating more whole foods  Orexigenic Control:  Denies problems with appetite and hunger signals.  Denies problems with satiety and satiation.  Denies problems with eating patterns and portion control.  Denies abnormal cravings. Denies feeling deprived or restricted.   Barriers identified: medical comorbidities.   Pharmacotherapy for weight loss: She is currently taking no anti-obesity medication.   Assessment and Plan    Treatment Plan For Obesity:  Recommended Dietary Goals  [name] is currently in the action stage of change. As such, her goal is to continue weight management plan. She has agreed to: continue current plan  Behavioral Intervention  We discussed the following Behavioral Modification Strategies today: decreasing simple carbohydrates , increasing fiber rich foods, and continue to work on maintaining a reduced calorie state, getting the recommended amount of protein, incorporating whole foods, making healthy choices, staying well hydrated and practicing mindfulness when eating..  Additional resources provided today: Handout on increasing daily activity and exercise goal setting  Recommended Physical Activity Goals  [name] has been advised to work up to 150 minutes of moderate intensity aerobic activity a week and strengthening exercises 2-3 times per week for cardiovascular health, weight loss maintenance and preservation of muscle mass.   She has agreed to :  Increase the intensity, frequency or duration of strengthening exercises , Increase the intensity, frequency or duration of aerobic exercises  , and continue to gradually increase the amount and intensity of exercise routine  Pharmacotherapy  We discussed various medication options to help [name] with her weight loss efforts and we both agreed to : start anti-obesity medication.  In addition to reduced calorie nutrition plan (RCNP), behavioral strategies and physical activity, [name] would benefit from pharmacotherapy to assist with hunger signals, satiety and cravings. This will reduce obesity-related health risks by inducing weight loss, and help reduce food consumption and adherence to [facility]) . It may also improve QOL by improving self-confidence and reduce the  setbacks associated with metabolic adaptations.  She has  hypertension, uncontrolled diabetes, MASLD so she benefits from GLP-1 therapy to aid in her weight loss process.  After  discussion of treatment options, mechanisms of action, benefits, side effects, contraindications and shared decision making she is agreeable to starting Mounjaro  2.5 mg once a week. Patient also made aware that medication is indicated for long-term management of obesity and the risk of weight regain following discontinuation of treatment and hence the importance of adhering to medical weight loss plan.  We demonstrated use of device and patient using teach back method was able to demonstrate proper technique.  Associated Conditions Addressed Today  Metabolic dysfunction-associated steatotic liver disease (MASLD) Assessment & Plan: Found on MRI of the abdomen in 2016.  She does not drink alcohol.  Recent CBC, liver enzymes and PT and INR were normal.  We discussed reducing saturated fats simple and complex carbs in diet.  Losing 15% of body weight may improve condition.  She also benefits from GLP-1 therapy.  She does not drink alcohol and is not on steatogenic drugs.  There is no family history of liver disease.  Orders: -     Tirzepatide ; Inject 2.5 mg into the skin once a week.  Dispense: 2 mL; Refill: 0  Primary hypertension Assessment & Plan: Blood pressure at goal for age and risk category.  On amlodipine  without adverse effects.  Most recent renal parameters reviewed which showed normal electrolytes and kidney function.  Continue with weight loss therapy. Losing 10% may improve blood pressure control. Monitor for symptoms of orthostasis while losing weight. Continue current regimen and home monitoring for a goal blood pressure of 120/80.  Review records to see if she has had a urine microalbumin completed to see if she warrants ARB or ACE therapy   Orders: -     Tirzepatide ; Inject 2.5 mg into the skin once a week.  Dispense: 2 mL; Refill: 0  Type 2 diabetes mellitus with other specified complication, without long-term current use of insulin  [facility]) Assessment & Plan: HgbA1c is not at goal  for age and comorbid conditions. Denies symptoms of hypoglycemia or hyperglycemia. On glipizide, Jardiance.  She recently stopped glipizide because of concerns with weight gain.  She is also high risk for hypoglycemia on SGLT2 drug.    Lab Results  Component Value Date   HGBA1C 8.5 (H) 08/09/2023   Lab Results  Component Value Date   LDLCALC 113 (H) 08/09/2023   CREATININE 0.80 08/09/2023   Counseled on goals of care, monitoring for complications and importance of staying updated on immunizations and diabetes preventive measures.  She will continue with reduced calorie meal plan low on processed crabs and simple sugars. Ongoing weight loss will improve insulin  resistance and glycemic control.  She will follow-up with primary care team to update diabetes preventive measures.  Her blood pressure is well-controlled.  Her LDL cholesterol is above target and she may benefit from higher intensity statin therapy.  She will review this with her primary care team.  Patient also benefits from GLP-1 therapy.  After discussion of benefits and risk she will be started on Mounjaro  2.5 mg once a week.     Orders: -     Tirzepatide ; Inject 2.5 mg into the skin once a week.  Dispense: 2 mL; Refill: 0  Class 2 severe obesity with serious comorbidity and body mass index (BMI) of 36.0 to 36.9 in adult, unspecified obesity type [facility]) Assessment & Plan: See obesity treatment plan  Orders: -     Tirzepatide ;  Inject 2.5 mg into the skin once a week.  Dispense: 2 mL; Refill: 0  Pancreatic mass Assessment & Plan: History unclear, 10 years ago, reports having biopsies at a surgical center in the area.  I do not see any follow-up imaging and she is under the impression that this has been ordered by her PCP.  I have encouraged patient to make sure that she has follow-up imaging scheduled.  This does not represent a contraindication to GLP-1 therapy as 7-year studies have not shown an increase incidence of  pancreatic cancer.  Her obesity and other comorbidities do increase her risk of gastrointestinal malignancy.     Objective   Physical Exam:  Blood pressure 103/61, pulse 87, temperature 98.1 F (36.7 C), height 5' 4 (1.626 m), weight 206 lb (93.4 kg), SpO2 98%. Body mass index is 35.36 kg/m.  General: She is overweight, cooperative, alert, well developed, and in no acute distress. PSYCH: Has normal mood, affect and thought process.   HEENT: EOMI, sclerae are anicteric. Lungs: Normal breathing effort, no conversational dyspnea. Extremities: No edema.  Neurologic: No gross sensory or motor deficits. No tremors or fasciculations noted.    Diagnostic Data Reviewed:  BMET    Component Value Date/Time   NA 139 08/09/2023 0838   K 4.3 08/09/2023 0838   CL 99 08/09/2023 0838   CO2 23 08/09/2023 0838   GLUCOSE 128 (H) 08/09/2023 0838   GLUCOSE 107 (H) 08/10/2015 1624   BUN 17 08/09/2023 0838   CREATININE 0.80 08/09/2023 0838   CALCIUM  9.4 08/09/2023 0838   GFRNONAA 46 (L) 08/10/2015 1624   GFRAA 54 (L) 08/10/2015 1624   Lab Results  Component Value Date   HGBA1C 8.5 (H) 08/09/2023   Lab Results  Component Value Date   INSULIN  17.3 08/09/2023   Lab Results  Component Value Date   TSH 1.250 08/09/2023   CBC    Component Value Date/Time   WBC 3.9 08/09/2023 0838   WBC 7.6 08/08/2015 1310   RBC 5.04 08/09/2023 0838   RBC 4.58 08/08/2015 1310   HGB 14.6 08/09/2023 0838   HCT 45.5 08/09/2023 0838   PLT 338 08/09/2023 0838   MCV 90 08/09/2023 0838   MCH 29.0 08/09/2023 0838   MCH 28.4 08/08/2015 1310   MCHC 32.1 08/09/2023 0838   MCHC 32.9 08/08/2015 1310   RDW 13.2 08/09/2023 0838   Iron Studies No results found for: IRON, TIBC, FERRITIN, IRONPCTSAT Lipid Panel     Component Value Date/Time   CHOL 179 08/09/2023 0838   TRIG 120 08/09/2023 0838   HDL 44 08/09/2023 0838   CHOLHDL 5.0 02/26/2010 1120   VLDL 16 02/26/2010 1120   LDLCALC 113 (H)  08/09/2023 0838   Hepatic Function Panel     Component Value Date/Time   PROT 6.7 08/09/2023 0838   ALBUMIN 4.2 08/09/2023 0838   AST 18 08/09/2023 0838   ALT 20 08/09/2023 0838   ALKPHOS 80 08/09/2023 0838   BILITOT 0.5 08/09/2023 0838      Component Value Date/Time   TSH 1.250 08/09/2023 0838   Nutritional Lab Results  Component Value Date   VD25OH 42.8 08/09/2023    Follow-Up   Return in about 3 weeks (around 09/13/2023) for For Weight Mangement with Dr. [email or web address].[email or web address] She was informed of the importance of frequent follow up visits to maximize her success with intensive lifestyle modifications for her multiple health conditions.  Attestation Statement   Reviewed by clinician on day of visit:  allergies, medications, problem list, medical history, surgical history, family history, social history, and previous encounter notes.   I have spent 40 minutes in the care of the patient today including: preparing to see patient (e.g. review and interpretation of tests, old notes ), obtaining and/or reviewing separately obtained history, performing a medically appropriate examination or evaluation, counseling and educating the patient, ordering medications, test or procedures, documenting clinical information in the electronic or other health care record, and independently interpreting results and communicating results to the patient, family, or caregiver   Lucas Parker, MD

## 2023-08-23 NOTE — Assessment & Plan Note (Signed)
 Found on MRI of the abdomen in 2016.  She does not drink alcohol.  Recent CBC, liver enzymes and PT and INR were normal.  We discussed reducing saturated fats simple and complex carbs in diet.  Losing 15% of body weight may improve condition.  She also benefits from GLP-1 therapy.  She does not drink alcohol and is not on steatogenic drugs.  There is no family history of liver disease.

## 2023-08-23 NOTE — Assessment & Plan Note (Signed)
 History unclear, 10 years ago, reports having biopsies at a surgical center in the area.  I do not see any follow-up imaging and she is under the impression that this has been ordered by her PCP.  I have encouraged patient to make sure that she has follow-up imaging scheduled.  This does not represent a contraindication to GLP-1 therapy as 7-year studies have not shown an increase incidence of pancreatic cancer.  Her obesity and other comorbidities do increase her risk of gastrointestinal malignancy.

## 2023-08-23 NOTE — Assessment & Plan Note (Signed)
 See obesity treatment plan

## 2023-08-24 DIAGNOSIS — E119 Type 2 diabetes mellitus without complications: Secondary | ICD-10-CM | POA: Diagnosis not present

## 2023-09-24 DIAGNOSIS — E119 Type 2 diabetes mellitus without complications: Secondary | ICD-10-CM | POA: Diagnosis not present

## 2023-09-26 ENCOUNTER — Ambulatory Visit (INDEPENDENT_AMBULATORY_CARE_PROVIDER_SITE_OTHER): Payer: Medicare Other | Admitting: Internal Medicine

## 2023-09-26 ENCOUNTER — Encounter (INDEPENDENT_AMBULATORY_CARE_PROVIDER_SITE_OTHER): Payer: Self-pay | Admitting: Internal Medicine

## 2023-09-26 DIAGNOSIS — K760 Fatty (change of) liver, not elsewhere classified: Secondary | ICD-10-CM | POA: Diagnosis not present

## 2023-09-26 DIAGNOSIS — E1169 Type 2 diabetes mellitus with other specified complication: Secondary | ICD-10-CM | POA: Diagnosis not present

## 2023-09-26 DIAGNOSIS — Z6836 Body mass index (BMI) 36.0-36.9, adult: Secondary | ICD-10-CM

## 2023-09-26 DIAGNOSIS — Z7985 Long-term (current) use of injectable non-insulin antidiabetic drugs: Secondary | ICD-10-CM

## 2023-09-26 DIAGNOSIS — E66812 Obesity, class 2: Secondary | ICD-10-CM

## 2023-09-26 DIAGNOSIS — I10 Essential (primary) hypertension: Secondary | ICD-10-CM

## 2023-09-26 DIAGNOSIS — Z7984 Long term (current) use of oral hypoglycemic drugs: Secondary | ICD-10-CM

## 2023-09-26 MED ORDER — TIRZEPATIDE 2.5 MG/0.5ML ~~LOC~~ SOAJ: 2.5000 mg | SUBCUTANEOUS | 0 refills | Status: DC

## 2023-09-26 NOTE — Progress Notes (Signed)
Office: [phone]  /  Fax: [phone]  Weight Summary And Biometrics  Vitals Temp: 97.9 F (36.6 C) BP: 131/83 Pulse Rate: 81 SpO2: 98 %   Anthropometric Measurements Height: 5\' 4"  (1.626 m) Weight: 206 lb (93.4 kg) BMI (Calculated): 35.34 Weight at Last Visit: 206 lb Weight Lost Since Last Visit: 0 lb Weight Gained Since Last Visit: 0 lb Starting Weight: 210lb Total Weight Loss (lbs): 4 lb (1.814 kg) Peak Weight: 222 lb   Body Composition  Body Fat %: 44.7 % Fat Mass (lbs): 92.4 lbs Muscle Mass (lbs): 108.6 lbs Total Body Water (lbs): 78.2 lbs Visceral Fat Rating : 14    RMR: 1800  Today's Visit #: 08/09/2023  No data recorded  Subjective   Chief Complaint: Obesity  [name] is here to discuss her progress with her obesity treatment plan. She is on the the Category 2 Plan and states she is following her eating plan approximately 80 % of the time. She states she is doing yard work 120 minutes 2 times per week.  Weight Progress Since Last Visit:  Discussed the use of AI scribe software for clinical note transcription with the patient, who gave verbal consent to proceed.  History of Present Illness   The patient, with diabetes, fatty liver disease, and hypertension, presents for medical weight management.  She is undergoing medical weight management due to obesity, which is contributing to her diabetes, fatty liver disease, and hypertension. She has been prescribed Mounjaro for the management of her diabetes but because of confusion on because she did not pick up medication.  There was initial confusion regarding the cost and supply of the medication. She initially did not start the medication due to the misunderstanding that the cost was for a weekly supply rather than a monthly supply. This led to her not picking up the medication, but she plans to start the injections today after clarifying the misunderstanding.  She has lost some weight, noting a  decrease in body fat, although she feels she has recently regained a few pounds, possibly due to fluid retention. She is currently taking valsartan 160/80 mg for blood pressure management and Jardiance 10 mg for diabetes. She previously took amlodipine and glipizide but has discontinued these medications.  In terms of nutrition, she is about 'eighty percent' consistent with her dietary changes, purchasing healthier foods despite the higher cost. She acknowledges skipping breakfast occasionally, which affects her eating habits later in the day. She is working on meal planning to improve consistency.  Regarding physical activity, she engages in yard work for 120 minutes twice a week and walks frequently at work. She does not consume alcohol and is aware of the need to reduce fat and sugar intake to manage her fatty liver disease.       Challenges affecting patient progress: none.   Orexigenic Control: Denies problems with appetite and hunger signals.  Denies problems with satiety and satiation.  Denies problems with eating patterns and portion control.  Denies abnormal cravings. Denies feeling deprived or restricted.   Pharmacotherapy for weight management: She is currently taking  was prescribed Mounjaro but has not started medication yet .   Assessment and Plan   Treatment Plan For Obesity:  Recommended Dietary Goals  [name] is currently in the action stage of change. As such, her goal is to continue weight management plan. She has agreed to: continue current plan  Behavioral Health and Counseling  We discussed the following behavioral modification strategies today: continue to  work on maintaining a reduced calorie state, getting the recommended amount of protein, incorporating whole foods, making healthy choices, staying well hydrated and practicing mindfulness when eating..  Additional education and resources provided today: None  Recommended Physical Activity Goals  [name] has  been advised to work up to 150 minutes of moderate intensity aerobic activity a week and strengthening exercises 2-3 times per week for cardiovascular health, weight loss maintenance and preservation of muscle mass.   She has agreed to :  Think about enjoyable ways to increase daily physical activity and overcoming barriers to exercise and Increase physical activity in their day and reduce sedentary time (increase NEAT).  Pharmacotherapy  We discussed various medication options to help [name] with her weight loss efforts and we both agreed to :  Mirant 2.5 mg once a week with the primary indication of type 2 diabetes secondary obesity  Associated Conditions Impacted by Obesity Treatment  Metabolic dysfunction-associated steatotic liver disease (MASLD)  Primary hypertension  Type 2 diabetes mellitus with other specified complication, without long-term current use of insulin (HCC)  Class 2 severe obesity with serious comorbidity and body mass index (BMI) of 36.0 to 36.9 in adult, unspecified obesity type (HCC)    Assessment and Plan    Obesity Presents for medical weight management. Did not start Mounjaro injections due to cost confusion. Lost weight and body fat since December, but recent weight gain likely due to fluid retention. Motivated and following a nutrition plan but occasionally skips meals, affecting progress. Emphasized consistent meal planning and gradual improvements. - Send prescription for [name] to [name]. - Start Mounjaro injections once a week. - Encourage consistent meal planning and not skipping breakfast. - Recommend protein-rich meals and snacks.  Type 2 Diabetes Mellitus Currently on Jardiance 10 mg. Mounjaro expected to aid in blood sugar control and appetite suppression. Discussed benefits of Mounjaro in diabetes management and reducing medication needs. - Continue Jardiance 10 mg daily. - Monitor blood sugar levels regularly. -  Follow-up in three weeks to assess [name] effectiveness and adjust dose if necessary.  Hypertension Currently on Valsartan hydrochlorothiazide. Blood pressure today is 131/83 mmHg, within acceptable range. Discussed potential for improved blood pressure control with weight loss from [facility]. - Continue Valsartan hydrochlorothiazide - Monitor blood pressure regularly.  Non-Alcoholic Fatty Liver Disease (NAFLD) Mounjaro expected to aid in weight loss, improving NAFLD. Emphasized reducing dietary fat and sugars. - Continue weight management plan. - Monitor liver function tests periodically.  General Health Maintenance Engaging in yard work and walking at work. Discussed shopping at affordable grocery stores like [employer] for healthy food options. - Encourage continued physical activity, especially as weather improves. - Recommend shopping at affordable grocery stores like [employer] for healthy food options.  Follow-up - Schedule follow-up appointment in three weeks.       Objective   Physical Exam:  Blood pressure 131/83, pulse 81, temperature 97.9 F (36.6 C), height 5\' 4"  (1.626 m), weight 206 lb (93.4 kg), SpO2 98%. Body mass index is 35.36 kg/m.  General: She is overweight, cooperative, alert, well developed, and in no acute distress. PSYCH: Has normal mood, affect and thought process.   HEENT: EOMI, sclerae are anicteric. Lungs: Normal breathing effort, no conversational dyspnea. Extremities: No edema.  Neurologic: No gross sensory or motor deficits. No tremors or fasciculations noted.    Diagnostic Data Reviewed:  BMET    Component Value Date/Time   NA 139 08/09/2023 0838   K 4.3 08/09/2023 1610  CL 99 08/09/2023 0838   CO2 23 08/09/2023 0838   GLUCOSE 128 (H) 08/09/2023 0838   GLUCOSE 107 (H) 08/10/2015 1624   BUN 17 08/09/2023 0838   CREATININE 0.80 08/09/2023 0838   CALCIUM 9.4 08/09/2023 0838   GFRNONAA 46 (L) 08/10/2015 1624   GFRAA 54 (L) 08/10/2015 1624    Lab Results  Component Value Date   HGBA1C 8.5 (H) 08/09/2023   Lab Results  Component Value Date   INSULIN 17.3 08/09/2023   Lab Results  Component Value Date   TSH 1.250 08/09/2023   CBC    Component Value Date/Time   WBC 3.9 08/09/2023 0838   WBC 7.6 08/08/2015 1310   RBC 5.04 08/09/2023 0838   RBC 4.58 08/08/2015 1310   HGB 14.6 08/09/2023 0838   HCT 45.5 08/09/2023 0838   PLT 338 08/09/2023 0838   MCV 90 08/09/2023 0838   MCH 29.0 08/09/2023 0838   MCH 28.4 08/08/2015 1310   MCHC 32.1 08/09/2023 0838   MCHC 32.9 08/08/2015 1310   RDW 13.2 08/09/2023 0838   Iron Studies No results found for: "IRON", "TIBC", "FERRITIN", "IRONPCTSAT" Lipid Panel     Component Value Date/Time   CHOL 179 08/09/2023 0838   TRIG 120 08/09/2023 0838   HDL 44 08/09/2023 0838   CHOLHDL 5.0 02/26/2010 1120   VLDL 16 02/26/2010 1120   LDLCALC 113 (H) 08/09/2023 0838   Hepatic Function Panel     Component Value Date/Time   PROT 6.7 08/09/2023 0838   ALBUMIN 4.2 08/09/2023 0838   AST 18 08/09/2023 0838   ALT 20 08/09/2023 0838   ALKPHOS 80 08/09/2023 0838   BILITOT 0.5 08/09/2023 0838      Component Value Date/Time   TSH 1.250 08/09/2023 0838   Nutritional Lab Results  Component Value Date   VD25OH 42.8 08/09/2023    Follow-Up   No follow-ups on file.Marland Kitchen She was informed of the importance of frequent follow up visits to maximize her success with intensive lifestyle modifications for her multiple health conditions.  Attestation Statement   Reviewed by clinician on day of visit: allergies, medications, problem list, medical history, surgical history, family history, social history, and previous encounter notes.     Worthy Rancher, MD

## 2023-10-22 DIAGNOSIS — E119 Type 2 diabetes mellitus without complications: Secondary | ICD-10-CM | POA: Diagnosis not present

## 2023-10-26 ENCOUNTER — Ambulatory Visit (INDEPENDENT_AMBULATORY_CARE_PROVIDER_SITE_OTHER): Payer: Medicare Other | Admitting: Internal Medicine

## 2023-10-31 ENCOUNTER — Ambulatory Visit (INDEPENDENT_AMBULATORY_CARE_PROVIDER_SITE_OTHER): Admitting: Family Medicine

## 2023-11-02 ENCOUNTER — Ambulatory Visit (INDEPENDENT_AMBULATORY_CARE_PROVIDER_SITE_OTHER): Admitting: Internal Medicine

## 2023-11-02 ENCOUNTER — Encounter (INDEPENDENT_AMBULATORY_CARE_PROVIDER_SITE_OTHER): Payer: Self-pay | Admitting: Internal Medicine

## 2023-11-02 VITALS — BP 110/74 | HR 93 | Temp 98.1°F | Ht 64.0 in | Wt 194.0 lb

## 2023-11-02 DIAGNOSIS — E66812 Obesity, class 2: Secondary | ICD-10-CM

## 2023-11-02 DIAGNOSIS — Z7985 Long-term (current) use of injectable non-insulin antidiabetic drugs: Secondary | ICD-10-CM

## 2023-11-02 DIAGNOSIS — K760 Fatty (change of) liver, not elsewhere classified: Secondary | ICD-10-CM | POA: Diagnosis not present

## 2023-11-02 DIAGNOSIS — E1169 Type 2 diabetes mellitus with other specified complication: Secondary | ICD-10-CM | POA: Diagnosis not present

## 2023-11-02 DIAGNOSIS — I10 Essential (primary) hypertension: Secondary | ICD-10-CM

## 2023-11-02 DIAGNOSIS — Z6836 Body mass index (BMI) 36.0-36.9, adult: Secondary | ICD-10-CM

## 2023-11-02 NOTE — Progress Notes (Deleted)
 Office: [phone]  /  Fax: [phone]  Weight Summary And Biometrics  Vitals Temp: 98.1 F (36.7 C) BP: 110/74 Pulse Rate: 93 SpO2: 98 %   Anthropometric Measurements Height: 5\' 4"  (1.626 m) Weight: 194 lb (88 kg) BMI (Calculated): 33.28 Weight at Last Visit: 206 lb Weight Lost Since Last Visit: 7 lb Weight Gained Since Last Visit: 0 l Starting Weight: 210 lb Total Weight Loss (lbs): 11 lb (4.99 kg) Peak Weight: 222 lb   Body Composition  Body Fat %: 43.3 % Fat Mass (lbs): 85.4 lbs Muscle Mass (lbs): 106.2 lbs Total Body Water (lbs): 76.4 lbs Visceral Fat Rating : 13    RMR: 1800  Today's Visit #: 08/09/2023  No data recorded  Subjective   Chief Complaint: Obesity  Interval History Discussed the use of AI scribe software for clinical note transcription with the patient, who gave verbal consent to proceed.  History of Present Illness            Challenges affecting patient progress: {EMOBESITYBARRIERS:28841}.    Pharmacotherapy for weight management: She is currently taking {EMPharmaco:28845}.   Assessment and Plan   Treatment Plan For Obesity:  Recommended Dietary Goals  Shaunie is currently in the action stage of change. As such, her goal is to continue weight management plan. She has agreed to: {EMWTLOSSPLAN:29297::"continue current plan"}  Behavioral Health and Counseling  We discussed the following behavioral modification strategies today: {EMWMwtlossstrategies:28914::"continue to work on maintaining a reduced calorie state, getting the recommended amount of protein, incorporating whole foods, making healthy choices, staying well hydrated and practicing mindfulness when eating."}.  Additional education and resources provided today: {EMadditionalresources:29169::"None"}  Recommended Physical Activity Goals  Dalesha has been advised to work up to 150 minutes of moderate intensity aerobic activity a week and strengthening exercises 2-3  times per week for cardiovascular health, weight loss maintenance and preservation of muscle mass.   She has agreed to :  {EMEXERCISE:28847::"Think about enjoyable ways to increase daily physical activity and overcoming barriers to exercise","Increase physical activity in their day and reduce sedentary time (increase NEAT)."}  Pharmacotherapy  We discussed various medication options to help [facility] with her weight loss efforts and we both agreed to : {EMagreedrx:29170}  Associated Conditions Impacted by Obesity Treatment  There are no diagnoses linked to this encounter.  Assessment and Plan               Objective   Physical Exam:  Blood pressure 110/74, pulse 93, temperature 98.1 F (36.7 C), height 5\' 4"  (1.626 m), weight 194 lb (88 kg), SpO2 98%. Body mass index is 33.3 kg/m.  General: She is overweight, cooperative, alert, well developed, and in no acute distress. PSYCH: Has normal mood, affect and thought process.   HEENT: EOMI, sclerae are anicteric. Lungs: Normal breathing effort, no conversational dyspnea. Extremities: No edema.  Neurologic: No gross sensory or motor deficits. No tremors or fasciculations noted.    Diagnostic Data Reviewed:  BMET    Component Value Date/Time   NA 139 08/09/2023 0838   K 4.3 08/09/2023 0838   CL 99 08/09/2023 0838   CO2 23 08/09/2023 0838   GLUCOSE 128 (H) 08/09/2023 0838   GLUCOSE 107 (H) 08/10/2015 1624   BUN 17 08/09/2023 0838   CREATININE 0.80 08/09/2023 0838   CALCIUM 9.4 08/09/2023 0838   GFRNONAA 46 (L) 08/10/2015 1624   GFRAA 54 (L) 08/10/2015 1624   Lab Results  Component Value Date   HGBA1C 8.5 (H) 08/09/2023  Lab Results  Component Value Date   INSULIN 17.3 08/09/2023   Lab Results  Component Value Date   TSH 1.250 08/09/2023   CBC    Component Value Date/Time   WBC 3.9 08/09/2023 0838   WBC 7.6 08/08/2015 1310   RBC 5.04 08/09/2023 0838   RBC 4.58 08/08/2015 1310   HGB 14.6 08/09/2023 0838    HCT 45.5 08/09/2023 0838   PLT 338 08/09/2023 0838   MCV 90 08/09/2023 0838   MCH 29.0 08/09/2023 0838   MCH 28.4 08/08/2015 1310   MCHC 32.1 08/09/2023 0838   MCHC 32.9 08/08/2015 1310   RDW 13.2 08/09/2023 0838   Iron Studies No results found for: "IRON", "TIBC", "FERRITIN", "IRONPCTSAT" Lipid Panel     Component Value Date/Time   CHOL 179 08/09/2023 0838   TRIG 120 08/09/2023 0838   HDL 44 08/09/2023 0838   CHOLHDL 5.0 02/26/2010 1120   VLDL 16 02/26/2010 1120   LDLCALC 113 (H) 08/09/2023 0838   Hepatic Function Panel     Component Value Date/Time   PROT 6.7 08/09/2023 0838   ALBUMIN 4.2 08/09/2023 0838   AST 18 08/09/2023 0838   ALT 20 08/09/2023 0838   ALKPHOS 80 08/09/2023 0838   BILITOT 0.5 08/09/2023 0838      Component Value Date/Time   TSH 1.250 08/09/2023 0838   Nutritional Lab Results  Component Value Date   VD25OH 42.8 08/09/2023    Medications: Outpatient Encounter Medications as of 11/02/2023  Medication Sig   acetaminophen (TYLENOL) 500 MG tablet Take 500 mg by mouth every 8 (eight) hours as needed for mild pain (pain score 1-3).   atorvastatin (LIPITOR) 20 MG tablet Take 20 mg by mouth daily.   Cholecalciferol (VITAMIN D3) 75 MCG (3000 UT) TABS Take by mouth daily at 12 noon. Unknown dosage   empagliflozin (JARDIANCE) 10 MG TABS tablet Take 10 mg by mouth daily.   Multiple Vitamin (MULTIVITAMIN WITH MINERALS) TABS tablet Take 1 tablet by mouth daily.   omega-3 acid ethyl esters (LOVAZA) 1 G capsule Take 1 g by mouth daily.   tirzepatide [facility]) 2.5 MG/0.5ML Pen Inject 2.5 mg into the skin once a week.   valsartan-hydrochlorothiazide (DIOVAN-HCT) 160-25 MG tablet Take 1 tablet by mouth daily.   GARLIC PO Take 1 capsule by mouth daily. (Patient not taking: Reported on 11/02/2023)   metFORMIN (GLUCOPHAGE-XR) 500 MG 24 hr tablet Take 1 tablet (500 mg total) by mouth daily with breakfast. (Patient not taking: Reported on 11/02/2023)   nitroGLYCERIN  (NITROSTAT) 0.4 MG SL tablet Place 1 tablet (0.4 mg total) under the tongue every 5 (five) minutes as needed for chest pain. (Patient not taking: Reported on 11/02/2023)   No facility-administered encounter medications on file as of 11/02/2023.     Follow-Up   No follow-ups on file.Marland Kitchen She was informed of the importance of frequent follow up visits to maximize her success with intensive lifestyle modifications for her multiple health conditions.  Attestation Statement   Reviewed by clinician on day of visit: allergies, medications, problem list, medical history, surgical history, family history, social history, and previous encounter notes.     Worthy Rancher, MD

## 2023-11-02 NOTE — Progress Notes (Signed)
 Office: [phone]  /  Fax: [phone]  Weight Summary And Biometrics  Vitals Temp: 98.1 F (36.7 C) BP: 110/74 Pulse Rate: 93 SpO2: 98 %   Anthropometric Measurements Height: 5\' 4"  (1.626 m) Weight: 194 lb (88 kg) BMI (Calculated): 33.28 Weight at Last Visit: 206 lb Weight Lost Since Last Visit: 7 lb Weight Gained Since Last Visit: 0 l Starting Weight: 210 lb Total Weight Loss (lbs): 11 lb (4.99 kg) Peak Weight: 222 lb   Body Composition  Body Fat %: 43.3 % Fat Mass (lbs): 85.4 lbs Muscle Mass (lbs): 106.2 lbs Total Body Water (lbs): 76.4 lbs Visceral Fat Rating : 13    RMR: 1800  Today's Visit #: 08/09/2023  No data recorded  Subjective   Chief Complaint: Obesity  [name] is here to discuss her progress with her obesity treatment plan. She is on the the Category 2 Plan and states she is following her eating plan approximately 70 to 80% % of the time. She states she is exercising 20 minutes 3 times per week.  Weight Progress Since Last Visit:  Since last office visit she has lost 9 pounds. She reports good adherence to reduced calorie nutritional plan. She has been working on reading food labels, not skipping meals, increasing protein intake at every meal, drinking more water, making healthier choices, reducing portion sizes, and incorporating more whole foods   Challenges affecting patient progress: none.   Orexigenic Control: Reports improved problems with appetite and hunger signals.  Denies problems with satiety and satiation.  Denies problems with eating patterns and portion control.  Denies abnormal cravings. Denies feeling deprived or restricted.   Pharmacotherapy for weight management: She is currently taking Monjauro with diabetes as the primary indication with adequate clinical response  and without side effects..  Her medication was recently refilled at the same dose therefore cannot be adjusted  Assessment and Plan   Treatment Plan  For Obesity:  Recommended Dietary Goals  [name] is currently in the action stage of change. As such, her goal is to continue weight management plan. She has agreed to: continue current plan  Behavioral Health and Counseling  We discussed the following behavioral modification strategies today: avoiding skipping meals and continue to work on maintaining a reduced calorie state, getting the recommended amount of protein, incorporating whole foods, making healthy choices, staying well hydrated and practicing mindfulness when eating..  Additional education and resources provided today: None  Recommended Physical Activity Goals  [name] has been advised to work up to 150 minutes of moderate intensity aerobic activity a week and strengthening exercises 2-3 times per week for cardiovascular health, weight loss maintenance and preservation of muscle mass.   She has agreed to :  Think about enjoyable ways to increase daily physical activity and overcoming barriers to exercise and Increase physical activity in their day and reduce sedentary time (increase NEAT).  Pharmacotherapy  We discussed various medication options to help [facility] with her weight loss efforts and we both agreed to : increase Mounjaro to 5 mg once a week starting in 3 weeks  Associated Conditions Impacted by Obesity Treatment  Primary hypertension Assessment & Plan: Blood pressure at goal for age and risk category.  On amlodipine and valsartan without adverse effects.  Most recent renal parameters reviewed which showed normal electrolytes and kidney function.  Continue with current weight management strategy      Metabolic dysfunction-associated steatotic liver disease (MASLD) Assessment & Plan: Found on MRI of the abdomen in 2016.  She does not drink alcohol.  Recent CBC, liver enzymes and PT and INR were normal.  We discussed reducing saturated fats simple and complex carbs in diet.  Losing 15% of body weight may improve  condition.  Continue GLP-1 therapy   Class 2 severe obesity with serious comorbidity and body mass index (BMI) of 36.0 to 36.9 in adult, unspecified obesity type [facility]) Assessment & Plan:    Obesity She has lost nine pounds since the last visit, with body fat percentage decreasing from 44% to 43% and visceral fat from 14 to 13. The target is a body fat percentage of 36% or less. She follows a reduced-calorie diet, consumes more whole foods, and exercises thrice weekly, preserving muscle mass while reducing body fat. Maintaining three meals daily is crucial to prevent muscle loss and avoid metabolic slowdown. - Continue reduced-calorie diet - Continue exercise regimen three times a week - Monitor body fat percentage and visceral fat - Encourage intake of fruits, vegetables, and protein - Ensure three meals daily to prevent muscle loss - Use protein shakes if necessary to avoid skipping meals         Type 2 diabetes mellitus with other specified complication, without long-term current use of insulin (HCC) Assessment & Plan: Type 2 Diabetes Mellitus She is on Mounjaro 2.5 mg and hesitant to take Jardiance due to hypoglycemia concerns. Her blood glucose levels have improved to 109-116 mg/dL preprandially from previous levels of 180-200 mg/dL. Increasing Mounjaro to 5 mg may enhance glycemic control and potentially eliminate the need for Jardiance, reducing hypoglycemia risk. - Increase Mounjaro to 5 mg after completing current 2.5 mg pens - Discontinue Jardiance to evaluate glycemic control with Mounjaro alone - Monitor blood glucose levels regularly - Ensure adequate hydration to prevent constipation - Follow-up in three weeks to reassess medication regimen        Objective   Physical Exam:  Blood pressure 110/74, pulse 93, temperature 98.1 F (36.7 C), height 5\' 4"  (1.626 m), weight 194 lb (88 kg), SpO2 98%. Body mass index is 33.3 kg/m.  General: She is overweight,  cooperative, alert, well developed, and in no acute distress. PSYCH: Has normal mood, affect and thought process.   HEENT: EOMI, sclerae are anicteric. Lungs: Normal breathing effort, no conversational dyspnea. Extremities: No edema.  Neurologic: No gross sensory or motor deficits. No tremors or fasciculations noted.    Diagnostic Data Reviewed:  BMET    Component Value Date/Time   NA 139 08/09/2023 0838   K 4.3 08/09/2023 0838   CL 99 08/09/2023 0838   CO2 23 08/09/2023 0838   GLUCOSE 128 (H) 08/09/2023 0838   GLUCOSE 107 (H) 08/10/2015 1624   BUN 17 08/09/2023 0838   CREATININE 0.80 08/09/2023 0838   CALCIUM 9.4 08/09/2023 0838   GFRNONAA 46 (L) 08/10/2015 1624   GFRAA 54 (L) 08/10/2015 1624   Lab Results  Component Value Date   HGBA1C 8.5 (H) 08/09/2023   Lab Results  Component Value Date   INSULIN 17.3 08/09/2023   Lab Results  Component Value Date   TSH 1.250 08/09/2023   CBC    Component Value Date/Time   WBC 3.9 08/09/2023 0838   WBC 7.6 08/08/2015 1310   RBC 5.04 08/09/2023 0838   RBC 4.58 08/08/2015 1310   HGB 14.6 08/09/2023 0838   HCT 45.5 08/09/2023 0838   PLT 338 08/09/2023 0838   MCV 90 08/09/2023 0838   MCH 29.0 08/09/2023 0838   MCH 28.4 08/08/2015  1310   MCHC 32.1 08/09/2023 0838   MCHC 32.9 08/08/2015 1310   RDW 13.2 08/09/2023 0838   Iron Studies No results found for: "IRON", "TIBC", "FERRITIN", "IRONPCTSAT" Lipid Panel     Component Value Date/Time   CHOL 179 08/09/2023 0838   TRIG 120 08/09/2023 0838   HDL 44 08/09/2023 0838   CHOLHDL 5.0 02/26/2010 1120   VLDL 16 02/26/2010 1120   LDLCALC 113 (H) 08/09/2023 0838   Hepatic Function Panel     Component Value Date/Time   PROT 6.7 08/09/2023 0838   ALBUMIN 4.2 08/09/2023 0838   AST 18 08/09/2023 0838   ALT 20 08/09/2023 0838   ALKPHOS 80 08/09/2023 0838   BILITOT 0.5 08/09/2023 0838      Component Value Date/Time   TSH 1.250 08/09/2023 0838   Nutritional Lab Results   Component Value Date   VD25OH 42.8 08/09/2023    Follow-Up   Return in about 3 weeks (around 11/23/2023) for For Weight Mangement with Dr. Rikki Spearing.Marland Kitchen She was informed of the importance of frequent follow up visits to maximize her success with intensive lifestyle modifications for her multiple health conditions.  Attestation Statement   Reviewed by clinician on day of visit: allergies, medications, problem list, medical history, surgical history, family history, social history, and previous encounter notes.     Worthy Rancher, MD

## 2023-11-02 NOTE — Assessment & Plan Note (Addendum)
    Obesity She has lost nine pounds since the last visit, with body fat percentage decreasing from 44% to 43% and visceral fat from 14 to 13. The target is a body fat percentage of 36% or less. She follows a reduced-calorie diet, consumes more whole foods, and exercises thrice weekly, preserving muscle mass while reducing body fat. Maintaining three meals daily is crucial to prevent muscle loss and avoid metabolic slowdown. - Continue reduced-calorie diet - Continue exercise regimen three times a week - Monitor body fat percentage and visceral fat - Encourage intake of fruits, vegetables, and protein - Ensure three meals daily to prevent muscle loss - Use protein shakes if necessary to avoid skipping meals

## 2023-11-02 NOTE — Assessment & Plan Note (Signed)
 Blood pressure at goal for age and risk category.  On amlodipine and valsartan without adverse effects.  Most recent renal parameters reviewed which showed normal electrolytes and kidney function.  Continue with current weight management strategy

## 2023-11-02 NOTE — Assessment & Plan Note (Signed)
 Type 2 Diabetes Mellitus She is on Mounjaro 2.5 mg and hesitant to take Jardiance due to hypoglycemia concerns. Her blood glucose levels have improved to 109-116 mg/dL preprandially from previous levels of 180-200 mg/dL. Increasing Mounjaro to 5 mg may enhance glycemic control and potentially eliminate the need for Jardiance, reducing hypoglycemia risk. - Increase Mounjaro to 5 mg after completing current 2.5 mg pens - Discontinue Jardiance to evaluate glycemic control with Mounjaro alone - Monitor blood glucose levels regularly - Ensure adequate hydration to prevent constipation - Follow-up in three weeks to reassess medication regimen

## 2023-11-02 NOTE — Assessment & Plan Note (Signed)
 Found on MRI of the abdomen in 2016.  She does not drink alcohol.  Recent CBC, liver enzymes and PT and INR were normal.  We discussed reducing saturated fats simple and complex carbs in diet.  Losing 15% of body weight may improve condition.  Continue GLP-1 therapy

## 2023-11-17 DIAGNOSIS — E1169 Type 2 diabetes mellitus with other specified complication: Secondary | ICD-10-CM | POA: Diagnosis not present

## 2023-11-22 DIAGNOSIS — E119 Type 2 diabetes mellitus without complications: Secondary | ICD-10-CM | POA: Diagnosis not present

## 2023-11-22 DIAGNOSIS — H5203 Hypermetropia, bilateral: Secondary | ICD-10-CM | POA: Diagnosis not present

## 2023-12-06 ENCOUNTER — Encounter (INDEPENDENT_AMBULATORY_CARE_PROVIDER_SITE_OTHER): Payer: Self-pay | Admitting: Internal Medicine

## 2023-12-06 ENCOUNTER — Ambulatory Visit (INDEPENDENT_AMBULATORY_CARE_PROVIDER_SITE_OTHER): Admitting: Internal Medicine

## 2023-12-06 DIAGNOSIS — Z6836 Body mass index (BMI) 36.0-36.9, adult: Secondary | ICD-10-CM

## 2023-12-06 DIAGNOSIS — Z7985 Long-term (current) use of injectable non-insulin antidiabetic drugs: Secondary | ICD-10-CM

## 2023-12-06 DIAGNOSIS — K760 Fatty (change of) liver, not elsewhere classified: Secondary | ICD-10-CM

## 2023-12-06 DIAGNOSIS — E1169 Type 2 diabetes mellitus with other specified complication: Secondary | ICD-10-CM | POA: Diagnosis not present

## 2023-12-06 DIAGNOSIS — I10 Essential (primary) hypertension: Secondary | ICD-10-CM

## 2023-12-06 DIAGNOSIS — E66812 Obesity, class 2: Secondary | ICD-10-CM | POA: Diagnosis not present

## 2023-12-06 MED ORDER — TIRZEPATIDE 5 MG/0.5ML ~~LOC~~ SOAJ: 5.0000 mg | SUBCUTANEOUS | 1 refills | Status: AC

## 2023-12-06 NOTE — Progress Notes (Signed)
 Office: [phone]  /  Fax: [phone]  Weight Summary And Biometrics  Vitals Temp: 97.8 F (36.6 C) BP: 125/71 Pulse Rate: 90 SpO2: 98 %   Anthropometric Measurements Height: 5\' 4"  (1.626 m) Weight: 199 lb (90.3 kg) BMI (Calculated): 34.14 Weight at Last Visit: 194 lb Weight Lost Since Last Visit: 5 lb Weight Gained Since Last Visit: 0 lb Starting Weight: 21 Total Weight Loss (lbs): 6 lb (2.722 kg) Peak Weight: 222 lb   Body Composition  Body Fat %: 43 % Fat Mass (lbs): 85.6 lbs Muscle Mass (lbs): 107.6 lbs Total Body Water (lbs): 78.4 lbs Visceral Fat Rating : 13    RMR: 1800  Today's Visit #: 08/09/2023  No data recorded  Subjective   Chief Complaint: Obesity  Interval History  Since last office visit she has gained 5 pounds she reports following category 2 meal plan about 70 to 80% of the time.  She is exercising 3 days a week about 20 minutes. Since last office she reports fair adherence to reduced calorie nutritional plan. to prescribed reduced calorie nutrition plan. Has been working on incorporating more whole foods, getting the recommended amount of protein maintaining adequate hydration and has not been skipping meals.  Patient is unsure as to why she has gained weight.  She feels that she is retaining water.  She also took a dose of prednisone recently Denies problems with appetite and hunger signals.  Denies problems with satiety and satiation.  Denies problems with eating patterns and portion control.  Sleeping approximately 8 hours a day.  Sleep described as restorative.  Stress levels are reported as low and manageable.  Barriers identified none.   Challenges affecting patient progress: menopause.    Pharmacotherapy for weight management: She is currently taking Monjauro with diabetes as the primary indication with adequate clinical response  and without side effects..   Assessment and Plan   Treatment Plan For  Obesity:  Recommended Dietary Goals  [name] is currently in the action stage of change. As such, her goal is to continue weight management plan. She has agreed to: follow the Category 2 plan - 1200 kcal per day  Behavioral Health and Counseling  We discussed the following behavioral modification strategies today: increasing lean protein intake to established goals, decreasing simple carbohydrates , increasing vegetables, increasing lower glycemic fruits, increasing fiber rich foods, avoiding skipping meals, and increasing water intake .  Additional education and resources provided today: None  Recommended Physical Activity Goals  Delecia has been advised to work up to 150 minutes of moderate intensity aerobic activity a week and strengthening exercises 2-3 times per week for cardiovascular health, weight loss maintenance and preservation of muscle mass.   She has agreed to :  continue to gradually increase the amount and intensity of exercise routine  Pharmacotherapy  We discussed various medication options to help [facility] with her weight loss efforts and we both agreed to : increase Mounjaro to 5 mg for maintenance primary indication type 2 diabetes secondary weight management  Associated Conditions Impacted by Obesity Treatment  Metabolic dysfunction-associated steatotic liver disease (MASLD) Assessment & Plan: Found on MRI of the abdomen in 2016.  She does not drink alcohol.  Recent CBC, liver enzymes and PT and INR were normal.  We discussed reducing saturated fats simple and complex carbs in diet.  Losing 15% of body weight may improve condition.  Continue GLP-1 therapy  Fibrosis 4 Score = .86  Fib-4 interpretation is not validated for people  under 35 or over 57 years of age. However, scores under 2.0 are generally considered low risk.   Orders: -     Tirzepatide; Inject 5 mg into the skin once a week.  Dispense: 2 mL; Refill: 1  Primary hypertension Assessment & Plan: Blood  pressure at goal for age and risk category.  On amlodipine and valsartan without adverse effects.  Most recent renal parameters reviewed which showed normal electrolytes and kidney function.  Continue with current weight management strategy      Type 2 diabetes mellitus with other specified complication, without long-term current use of insulin (HCC) Assessment & Plan: I reviewed labs in Care Everywhere her most recent A1c is 7.1 and improved from 7.9.  She had normal renal parameters.  We are increasing Mounjaro to 5 mg once a week.  Patient counseled again on maintaining a diet with a low glycemic load.  Orders: -     Tirzepatide; Inject 5 mg into the skin once a week.  Dispense: 2 mL; Refill: 1  Class 2 severe obesity with serious comorbidity and body mass index (BMI) of 36.0 to 36.9 in adult, unspecified obesity type [facility]) Assessment & Plan: Patient has gained 5 pounds since last office visit, unexplained she denies any changes in nutrition or physical activity levels.  BIA information suggests slight increase in muscle mass and fluid retention.  We are increasing Mounjaro to 5 mg once a week.  Patient will continue to work on increasing protein intake maintaining a diet with a low glycemic load and incorporating more whole foods.  She will also work on increasing volume of physical activity.  Continue with 1200-calorie target.  Orders: -     Tirzepatide; Inject 5 mg into the skin once a week.  Dispense: 2 mL; Refill: 1         Objective   Physical Exam:  Blood pressure 125/71, pulse 90, temperature 97.8 F (36.6 C), height 5\' 4"  (1.626 m), weight 199 lb (90.3 kg), SpO2 98%. Body mass index is 34.16 kg/m.  General: She is overweight, cooperative, alert, well developed, and in no acute distress. PSYCH: Has normal mood, affect and thought process.   HEENT: EOMI, sclerae are anicteric. Lungs: Normal breathing effort, no conversational dyspnea. Extremities: No edema.   Neurologic: No gross sensory or motor deficits. No tremors or fasciculations noted.    Diagnostic Data Reviewed:  BMET    Component Value Date/Time   NA 139 08/09/2023 0838   K 4.3 08/09/2023 0838   CL 99 08/09/2023 0838   CO2 23 08/09/2023 0838   GLUCOSE 128 (H) 08/09/2023 0838   GLUCOSE 107 (H) 08/10/2015 1624   BUN 17 08/09/2023 0838   CREATININE 0.80 08/09/2023 0838   CALCIUM 9.4 08/09/2023 0838   GFRNONAA 46 (L) 08/10/2015 1624   GFRAA 54 (L) 08/10/2015 1624   Lab Results  Component Value Date   HGBA1C 8.5 (H) 08/09/2023   Lab Results  Component Value Date   INSULIN 17.3 08/09/2023   Lab Results  Component Value Date   TSH 1.250 08/09/2023   CBC    Component Value Date/Time   WBC 3.9 08/09/2023 0838   WBC 7.6 08/08/2015 1310   RBC 5.04 08/09/2023 0838   RBC 4.58 08/08/2015 1310   HGB 14.6 08/09/2023 0838   HCT 45.5 08/09/2023 0838   PLT 338 08/09/2023 0838   MCV 90 08/09/2023 0838   MCH 29.0 08/09/2023 0838   MCH 28.4 08/08/2015 1310   MCHC 32.1  08/09/2023 0838   MCHC 32.9 08/08/2015 1310   RDW 13.2 08/09/2023 0838   Iron Studies No results found for: "IRON", "TIBC", "FERRITIN", "IRONPCTSAT" Lipid Panel     Component Value Date/Time   CHOL 179 08/09/2023 0838   TRIG 120 08/09/2023 0838   HDL 44 08/09/2023 0838   CHOLHDL 5.0 02/26/2010 1120   VLDL 16 02/26/2010 1120   LDLCALC 113 (H) 08/09/2023 0838   Hepatic Function Panel     Component Value Date/Time   PROT 6.7 08/09/2023 0838   ALBUMIN 4.2 08/09/2023 0838   AST 18 08/09/2023 0838   ALT 20 08/09/2023 0838   ALKPHOS 80 08/09/2023 0838   BILITOT 0.5 08/09/2023 0838      Component Value Date/Time   TSH 1.250 08/09/2023 0838   Nutritional Lab Results  Component Value Date   VD25OH 42.8 08/09/2023    Medications: Outpatient Encounter Medications as of 12/06/2023  Medication Sig   acetaminophen (TYLENOL) 500 MG tablet Take 500 mg by mouth every 8 (eight) hours as needed for mild  pain (pain score 1-3).   atorvastatin (LIPITOR) 20 MG tablet Take 20 mg by mouth daily.   Cholecalciferol (VITAMIN D3) 75 MCG (3000 UT) TABS Take by mouth daily at 12 noon. Unknown dosage   empagliflozin (JARDIANCE) 10 MG TABS tablet Take 10 mg by mouth daily.   Multiple Vitamin (MULTIVITAMIN WITH MINERALS) TABS tablet Take 1 tablet by mouth daily.   omega-3 acid ethyl esters (LOVAZA) 1 G capsule Take 1 g by mouth daily.   tirzepatide (MOUNJARO) 5 MG/0.5ML Pen Inject 5 mg into the skin once a week.   valsartan-hydrochlorothiazide (DIOVAN-HCT) 160-25 MG tablet Take 1 tablet by mouth daily.   [DISCONTINUED] GARLIC PO Take 1 capsule by mouth daily.   [DISCONTINUED] nitroGLYCERIN (NITROSTAT) 0.4 MG SL tablet Place 1 tablet (0.4 mg total) under the tongue every 5 (five) minutes as needed for chest pain.   [DISCONTINUED] tirzepatide (MOUNJARO) 2.5 MG/0.5ML Pen Inject 2.5 mg into the skin once a week.   [DISCONTINUED] metFORMIN (GLUCOPHAGE-XR) 500 MG 24 hr tablet Take 1 tablet (500 mg total) by mouth daily with breakfast. (Patient not taking: Reported on 12/06/2023)   No facility-administered encounter medications on file as of 12/06/2023.     Follow-Up   Return in about 4 weeks (around 01/03/2024) for For Weight Mangement with Dr. Allie Area.Aaron Aas She was informed of the importance of frequent follow up visits to maximize her success with intensive lifestyle modifications for her multiple health conditions.  Attestation Statement   Reviewed by clinician on day of visit: allergies, medications, problem list, medical history, surgical history, family history, social history, and previous encounter notes.     Ladd Picker, MD

## 2023-12-06 NOTE — Assessment & Plan Note (Signed)
 Blood pressure at goal for age and risk category.  On amlodipine and valsartan without adverse effects.  Most recent renal parameters reviewed which showed normal electrolytes and kidney function.  Continue with current weight management strategy

## 2023-12-06 NOTE — Assessment & Plan Note (Addendum)
 Found on MRI of the abdomen in 2016.  She does not drink alcohol.  Recent CBC, liver enzymes and PT and INR were normal.  We discussed reducing saturated fats simple and complex carbs in diet.  Losing 15% of body weight may improve condition.  Continue GLP-1 therapy  Fibrosis 4 Score = .86  Fib-4 interpretation is not validated for people under 35 or over 73 years of age. However, scores under 2.0 are generally considered low risk.

## 2023-12-06 NOTE — Progress Notes (Deleted)
 Office: [phone]  /  Fax: [phone]  Weight Summary And Biometrics  Vitals Temp: 97.8 F (36.6 C) BP: 125/71 Pulse Rate: 90 SpO2: 98 %   Anthropometric Measurements Height: 5\' 4"  (1.626 m) Weight: 199 lb (90.3 kg) BMI (Calculated): 34.14 Weight at Last Visit: 194 lb Weight Lost Since Last Visit: 5 lb Weight Gained Since Last Visit: 0 lb Starting Weight: 21 Total Weight Loss (lbs): 6 lb (2.722 kg) Peak Weight: 222 lb   Body Composition  Body Fat %: 43 % Fat Mass (lbs): 85.6 lbs Muscle Mass (lbs): 107.6 lbs Total Body Water (lbs): 78.4 lbs Visceral Fat Rating : 13    RMR: 1800  Today's Visit #: 08/09/2023  No data recorded  Subjective   Chief Complaint: Obesity  Interval History ***  Challenges affecting patient progress: {EMOBESITYBARRIERS:28841::"none"}.    Pharmacotherapy for weight management: She is currently taking {EMPharmaco:28845}.   Assessment and Plan   Treatment Plan For Obesity:  Recommended Dietary Goals  [name] is currently in the action stage of change. As such, her goal is to continue weight management plan. She has agreed to: {EMWTLOSSPLAN:29297::"continue current plan"}  Behavioral Health and Counseling  We discussed the following behavioral modification strategies today: {EMWMwtlossstrategies:28914::"continue to work on maintaining a reduced calorie state, getting the recommended amount of protein, incorporating whole foods, making healthy choices, staying well hydrated and practicing mindfulness when eating."}.  Additional education and resources provided today: {EMadditionalresources:29169::"None"}  Recommended Physical Activity Goals  [name] has been advised to work up to 150 minutes of moderate intensity aerobic activity a week and strengthening exercises 2-3 times per week for cardiovascular health, weight loss maintenance and preservation of muscle mass.   She has agreed to :  {EMEXERCISE:28847::"Think about  enjoyable ways to increase daily physical activity and overcoming barriers to exercise","Increase physical activity in their day and reduce sedentary time (increase NEAT)."}  Pharmacotherapy  We discussed various medication options to help [facility] with her weight loss efforts and we both agreed to : {EMagreedrx:29170}  Associated Conditions Impacted by Obesity Treatment  Metabolic dysfunction-associated steatotic liver disease (MASLD) -     Tirzepatide; Inject 5 mg into the skin once a week.  Dispense: 2 mL; Refill: 1  Primary hypertension  Type 2 diabetes mellitus with other specified complication, without long-term current use of insulin (HCC) -     Tirzepatide; Inject 5 mg into the skin once a week.  Dispense: 2 mL; Refill: 1  Class 2 severe obesity with serious comorbidity and body mass index (BMI) of 36.0 to 36.9 in adult, unspecified obesity type (HCC) -     Tirzepatide; Inject 5 mg into the skin once a week.  Dispense: 2 mL; Refill: 1     ***  Objective   Physical Exam:  Blood pressure 125/71, pulse 90, temperature 97.8 F (36.6 C), height 5\' 4"  (1.626 m), weight 199 lb (90.3 kg), SpO2 98%. Body mass index is 34.16 kg/m.  General: She is overweight, cooperative, alert, well developed, and in no acute distress. PSYCH: Has normal mood, affect and thought process.   HEENT: EOMI, sclerae are anicteric. Lungs: Normal breathing effort, no conversational dyspnea. Extremities: No edema.  Neurologic: No gross sensory or motor deficits. No tremors or fasciculations noted.    Diagnostic Data Reviewed:  BMET    Component Value Date/Time   NA 139 08/09/2023 0838   K 4.3 08/09/2023 0838   CL 99 08/09/2023 0838   CO2 23 08/09/2023 0838   GLUCOSE 128 (H) 08/09/2023  1610   GLUCOSE 107 (H) 08/10/2015 1624   BUN 17 08/09/2023 0838   CREATININE 0.80 08/09/2023 0838   CALCIUM 9.4 08/09/2023 0838   GFRNONAA 46 (L) 08/10/2015 1624   GFRAA 54 (L) 08/10/2015 1624   Lab Results   Component Value Date   HGBA1C 8.5 (H) 08/09/2023   Lab Results  Component Value Date   INSULIN 17.3 08/09/2023   Lab Results  Component Value Date   TSH 1.250 08/09/2023   CBC    Component Value Date/Time   WBC 3.9 08/09/2023 0838   WBC 7.6 08/08/2015 1310   RBC 5.04 08/09/2023 0838   RBC 4.58 08/08/2015 1310   HGB 14.6 08/09/2023 0838   HCT 45.5 08/09/2023 0838   PLT 338 08/09/2023 0838   MCV 90 08/09/2023 0838   MCH 29.0 08/09/2023 0838   MCH 28.4 08/08/2015 1310   MCHC 32.1 08/09/2023 0838   MCHC 32.9 08/08/2015 1310   RDW 13.2 08/09/2023 0838   Iron Studies No results found for: "IRON", "TIBC", "FERRITIN", "IRONPCTSAT" Lipid Panel     Component Value Date/Time   CHOL 179 08/09/2023 0838   TRIG 120 08/09/2023 0838   HDL 44 08/09/2023 0838   CHOLHDL 5.0 02/26/2010 1120   VLDL 16 02/26/2010 1120   LDLCALC 113 (H) 08/09/2023 0838   Hepatic Function Panel     Component Value Date/Time   PROT 6.7 08/09/2023 0838   ALBUMIN 4.2 08/09/2023 0838   AST 18 08/09/2023 0838   ALT 20 08/09/2023 0838   ALKPHOS 80 08/09/2023 0838   BILITOT 0.5 08/09/2023 0838      Component Value Date/Time   TSH 1.250 08/09/2023 0838   Nutritional Lab Results  Component Value Date   VD25OH 42.8 08/09/2023    Medications: Outpatient Encounter Medications as of 12/06/2023  Medication Sig   acetaminophen (TYLENOL) 500 MG tablet Take 500 mg by mouth every 8 (eight) hours as needed for mild pain (pain score 1-3).   atorvastatin (LIPITOR) 20 MG tablet Take 20 mg by mouth daily.   Cholecalciferol (VITAMIN D3) 75 MCG (3000 UT) TABS Take by mouth daily at 12 noon. Unknown dosage   empagliflozin (JARDIANCE) 10 MG TABS tablet Take 10 mg by mouth daily.   Multiple Vitamin (MULTIVITAMIN WITH MINERALS) TABS tablet Take 1 tablet by mouth daily.   omega-3 acid ethyl esters (LOVAZA) 1 G capsule Take 1 g by mouth daily.   tirzepatide (MOUNJARO) 5 MG/0.5ML Pen Inject 5 mg into the skin once a  week.   valsartan-hydrochlorothiazide (DIOVAN-HCT) 160-25 MG tablet Take 1 tablet by mouth daily.   [DISCONTINUED] GARLIC PO Take 1 capsule by mouth daily.   [DISCONTINUED] nitroGLYCERIN (NITROSTAT) 0.4 MG SL tablet Place 1 tablet (0.4 mg total) under the tongue every 5 (five) minutes as needed for chest pain.   [DISCONTINUED] tirzepatide (MOUNJARO) 2.5 MG/0.5ML Pen Inject 2.5 mg into the skin once a week.   [DISCONTINUED] metFORMIN (GLUCOPHAGE-XR) 500 MG 24 hr tablet Take 1 tablet (500 mg total) by mouth daily with breakfast. (Patient not taking: Reported on 12/06/2023)   No facility-administered encounter medications on file as of 12/06/2023.     Follow-Up   Return in about 4 weeks (around 01/03/2024) for For Weight Mangement with Dr. Allie Area.Aaron Aas She was informed of the importance of frequent follow up visits to maximize her success with intensive lifestyle modifications for her multiple health conditions.  Attestation Statement   Reviewed by clinician on day of visit: allergies, medications, problem list,  medical history, surgical history, family history, social history, and previous encounter notes.     Ladd Picker, MD

## 2023-12-06 NOTE — Assessment & Plan Note (Signed)
 I reviewed labs in Care Everywhere her most recent A1c is 7.1 and improved from 7.9.  She had normal renal parameters.  We are increasing Mounjaro to 5 mg once a week.  Patient counseled again on maintaining a diet with a low glycemic load.

## 2023-12-06 NOTE — Assessment & Plan Note (Signed)
 Patient has gained 5 pounds since last office visit, unexplained she denies any changes in nutrition or physical activity levels.  BIA information suggests slight increase in muscle mass and fluid retention.  We are increasing Mounjaro to 5 mg once a week.  Patient will continue to work on increasing protein intake maintaining a diet with a low glycemic load and incorporating more whole foods.  She will also work on increasing volume of physical activity.  Continue with 1200-calorie target.

## 2023-12-22 DIAGNOSIS — E119 Type 2 diabetes mellitus without complications: Secondary | ICD-10-CM | POA: Diagnosis not present

## 2024-01-12 ENCOUNTER — Ambulatory Visit (INDEPENDENT_AMBULATORY_CARE_PROVIDER_SITE_OTHER): Admitting: Internal Medicine

## 2024-01-22 DIAGNOSIS — E119 Type 2 diabetes mellitus without complications: Secondary | ICD-10-CM | POA: Diagnosis not present

## 2024-02-07 ENCOUNTER — Ambulatory Visit (INDEPENDENT_AMBULATORY_CARE_PROVIDER_SITE_OTHER): Admitting: Internal Medicine

## 2024-02-21 DIAGNOSIS — E119 Type 2 diabetes mellitus without complications: Secondary | ICD-10-CM | POA: Diagnosis not present

## 2024-02-29 DIAGNOSIS — E1169 Type 2 diabetes mellitus with other specified complication: Secondary | ICD-10-CM | POA: Diagnosis not present

## 2024-02-29 DIAGNOSIS — R82998 Other abnormal findings in urine: Secondary | ICD-10-CM | POA: Diagnosis not present

## 2024-03-05 ENCOUNTER — Other Ambulatory Visit (INDEPENDENT_AMBULATORY_CARE_PROVIDER_SITE_OTHER): Payer: Self-pay | Admitting: Internal Medicine

## 2024-03-05 DIAGNOSIS — E1169 Type 2 diabetes mellitus with other specified complication: Secondary | ICD-10-CM

## 2024-03-05 DIAGNOSIS — I10 Essential (primary) hypertension: Secondary | ICD-10-CM

## 2024-03-05 DIAGNOSIS — K760 Fatty (change of) liver, not elsewhere classified: Secondary | ICD-10-CM

## 2024-03-23 DIAGNOSIS — E119 Type 2 diabetes mellitus without complications: Secondary | ICD-10-CM | POA: Diagnosis not present

## 2024-04-23 DIAGNOSIS — E119 Type 2 diabetes mellitus without complications: Secondary | ICD-10-CM | POA: Diagnosis not present

## 2024-05-23 DIAGNOSIS — E119 Type 2 diabetes mellitus without complications: Secondary | ICD-10-CM | POA: Diagnosis not present

## 2024-06-23 DIAGNOSIS — E119 Type 2 diabetes mellitus without complications: Secondary | ICD-10-CM | POA: Diagnosis not present

## 2024-07-16 DIAGNOSIS — K080 Exfoliation of teeth due to systemic causes: Secondary | ICD-10-CM | POA: Diagnosis not present
